# Patient Record
Sex: Male | Born: 1965 | Race: White | Hispanic: No | Marital: Married | Smoking: Never smoker
Health system: Southern US, Community
[De-identification: ages and names within clinical notes are randomized; demographics above are authoritative.]

## PROBLEM LIST (undated history)

## (undated) DIAGNOSIS — K219 Gastro-esophageal reflux disease without esophagitis: Secondary | ICD-10-CM

## (undated) DIAGNOSIS — F419 Anxiety disorder, unspecified: Secondary | ICD-10-CM

## (undated) DIAGNOSIS — R7989 Other specified abnormal findings of blood chemistry: Secondary | ICD-10-CM

## (undated) DIAGNOSIS — E785 Hyperlipidemia, unspecified: Secondary | ICD-10-CM

## (undated) DIAGNOSIS — Z87442 Personal history of urinary calculi: Secondary | ICD-10-CM

## (undated) DIAGNOSIS — E039 Hypothyroidism, unspecified: Secondary | ICD-10-CM

## (undated) HISTORY — DX: Hyperlipidemia, unspecified: E78.5

## (undated) HISTORY — DX: Anxiety disorder, unspecified: F41.9

## (undated) HISTORY — DX: Personal history of urinary calculi: Z87.442

## (undated) HISTORY — DX: Hypothyroidism, unspecified: E03.9

## (undated) HISTORY — DX: Gastro-esophageal reflux disease without esophagitis: K21.9

## (undated) HISTORY — DX: Other specified abnormal findings of blood chemistry: R79.89

---

## 2006-03-10 ENCOUNTER — Emergency Department: Payer: Self-pay | Admitting: Emergency Medicine

## 2011-09-30 ENCOUNTER — Ambulatory Visit: Payer: Self-pay

## 2011-10-16 ENCOUNTER — Ambulatory Visit: Payer: Self-pay

## 2012-11-14 ENCOUNTER — Ambulatory Visit: Payer: Self-pay | Admitting: Emergency Medicine

## 2015-04-16 ENCOUNTER — Telehealth: Payer: Self-pay | Admitting: Family Medicine

## 2015-04-18 ENCOUNTER — Telehealth: Payer: Self-pay

## 2015-04-18 NOTE — Telephone Encounter (Signed)
Patient called to advise that he had not received a returned phone call regarding his refill request.  Explained that there was an error with routing his original message and that his refill request would be submitted for approval.  Please call patient to advise when/if prescription is available for pick up.

## 2015-04-20 MED ORDER — LORAZEPAM 1 MG PO TABS: 1.0000 mg | ORAL_TABLET | Freq: Every day | ORAL | Status: DC | PRN

## 2015-04-20 NOTE — Telephone Encounter (Signed)
Refill lorezapam To call in

## 2015-04-21 NOTE — Telephone Encounter (Signed)
Patient called to check the status of his refill request for Lorazepam . No pharmacy noted as patient will have to pick up prescription.

## 2015-04-21 NOTE — Telephone Encounter (Signed)
rx was [facility] to [facility] earlier today, per another msg

## 2016-01-23 ENCOUNTER — Other Ambulatory Visit: Payer: Self-pay | Admitting: Family Medicine

## 2016-01-26 ENCOUNTER — Other Ambulatory Visit: Payer: Self-pay | Admitting: Family Medicine

## 2016-01-26 ENCOUNTER — Encounter: Payer: Self-pay | Admitting: Family Medicine

## 2016-01-26 NOTE — Telephone Encounter (Signed)
Letter sent.

## 2016-01-26 NOTE — Telephone Encounter (Signed)
apt 

## 2016-01-27 ENCOUNTER — Other Ambulatory Visit: Payer: Self-pay | Admitting: Unknown Physician Specialty

## 2016-01-28 NOTE — Telephone Encounter (Signed)
Letter was sent to patient by Gina on 4/17[name]/17 for Dr. Dossie Arbourrissman.

## 2016-01-28 NOTE — Telephone Encounter (Signed)
Needs appt further refills 

## 2016-01-30 DIAGNOSIS — K219 Gastro-esophageal reflux disease without esophagitis: Secondary | ICD-10-CM | POA: Insufficient documentation

## 2016-01-30 DIAGNOSIS — E039 Hypothyroidism, unspecified: Secondary | ICD-10-CM | POA: Insufficient documentation

## 2016-01-30 DIAGNOSIS — E785 Hyperlipidemia, unspecified: Secondary | ICD-10-CM | POA: Insufficient documentation

## 2016-01-30 DIAGNOSIS — Z87442 Personal history of urinary calculi: Secondary | ICD-10-CM | POA: Insufficient documentation

## 2016-01-30 DIAGNOSIS — F419 Anxiety disorder, unspecified: Secondary | ICD-10-CM | POA: Insufficient documentation

## 2016-02-02 ENCOUNTER — Telehealth: Payer: Self-pay | Admitting: Family Medicine

## 2016-02-02 MED ORDER — PRAVASTATIN SODIUM 20 MG PO TABS: 20.0000 mg | ORAL_TABLET | Freq: Every day | ORAL | Status: DC

## 2016-02-02 NOTE — Telephone Encounter (Signed)
Pt also needs Pravastatin sent to pharmacy.  Per pt they were supposed to request this with the levothyroxine.  He has made an appt for May 17 for med fu.

## 2016-02-25 ENCOUNTER — Encounter: Payer: Self-pay | Admitting: Family Medicine

## 2016-02-25 ENCOUNTER — Ambulatory Visit (INDEPENDENT_AMBULATORY_CARE_PROVIDER_SITE_OTHER): Payer: 59 | Admitting: Family Medicine

## 2016-02-25 VITALS — BP 111/80 | HR 92 | Temp 98.0°F | Ht 65.1 in | Wt 205.0 lb

## 2016-02-25 DIAGNOSIS — F329 Major depressive disorder, single episode, unspecified: Secondary | ICD-10-CM

## 2016-02-25 DIAGNOSIS — F32A Depression, unspecified: Secondary | ICD-10-CM

## 2016-02-25 DIAGNOSIS — E785 Hyperlipidemia, unspecified: Secondary | ICD-10-CM | POA: Diagnosis not present

## 2016-02-25 DIAGNOSIS — E039 Hypothyroidism, unspecified: Secondary | ICD-10-CM | POA: Diagnosis not present

## 2016-02-25 DIAGNOSIS — R5383 Other fatigue: Secondary | ICD-10-CM | POA: Diagnosis not present

## 2016-02-25 DIAGNOSIS — F339 Major depressive disorder, recurrent, unspecified: Secondary | ICD-10-CM | POA: Insufficient documentation

## 2016-02-25 MED ORDER — FLUOXETINE HCL 20 MG PO TABS: 20.0000 mg | ORAL_TABLET | Freq: Every day | ORAL | Status: DC

## 2016-02-25 NOTE — Progress Notes (Signed)
BP 111/80 mmHg  Pulse 92  Temp(Src) 98 F (36.7 C)  Ht 5' 5.1" (1.654 m)  Wt 205 lb (92.987 kg)  BMI 33.99 kg/m2  SpO2 98%   Subjective:    Patient ID: Michael Gillespie, male    DOB: [birth year], 50 y.o.   MRN: [id number]  HPI: Michael Gillespie is a 50 y.o. male  Chief Complaint  Patient presents with  . Hyperlipidemia  . Hypothyroidism  . Anxiety  Patient with multiple concerns. Primarily extreme stress at home with pregnant daughter and breakup with her husband and living with him, mother slowly dying with dementia, 73 year old son with ADHD area patient finds work a release. Having great deal of problems with sleep and energy loss of interest in usual things being depressed and sad and blue. Patient concerned may have low testosterone causing these symptoms has is read on the Internet. Taking lorazepam rarely. Taking cholesterol medicine without problems or issues.  Relevant past medical, surgical, family and social history reviewed and updated as indicated. Interim medical history since our last visit reviewed. Allergies and medications reviewed and updated.  Other than noted above Review of Systems  Constitutional: Positive for fatigue.  Respiratory: Negative.   Cardiovascular: Negative.     Per HPI unless specifically indicated above     Objective:    BP 111/80 mmHg  Pulse 92  Temp(Src) 98 F (36.7 C)  Ht 5' 5.1" (1.654 m)  Wt 205 lb (92.987 kg)  BMI 33.99 kg/m2  SpO2 98%  Wt Readings from Last 3 Encounters:  02/25/16 205 lb (92.987 kg)  12/31/14 183 lb (83.008 kg)    Physical Exam  Constitutional: He is oriented to person, place, and time. He appears well-developed and well-nourished. No distress.  HENT:  Head: Normocephalic and atraumatic.  Right Ear: Hearing normal.  Left Ear: Hearing normal.  Nose: Nose normal.  Eyes: Conjunctivae and lids are normal. Right eye exhibits no discharge. Left eye exhibits no discharge. No scleral icterus.  Neck: No  thyromegaly present.  Cardiovascular: Normal rate, regular rhythm and normal heart sounds.   Pulmonary/Chest: Effort normal and breath sounds normal. No respiratory distress.  Musculoskeletal: Normal range of motion.  Neurological: He is alert and oriented to person, place, and time.  Skin: Skin is intact. No rash noted.  Psychiatric: He has a normal mood and affect. His speech is normal and behavior is normal. Judgment and thought content normal. Cognition and memory are normal.    No results found for this or any previous visit.    Assessment & Plan:   Problem List Items Addressed This Visit      Endocrine   Hypothyroidism    The current medical regimen is effective;  continue present plan and medications.       Relevant Orders   TSH   Testosterone     Other   Hyperlipidemia    The current medical regimen is effective;  continue present plan and medications.       Depression - Primary    Discussed depression care and treatment will start fluoxetine patient education given cautions about activation.      Relevant Medications   FLUoxetine (PROZAC) 20 MG tablet   Other Relevant Orders   Comprehensive metabolic panel   CBC with Differential/Platelet   Testosterone    Other Visit Diagnoses    Other fatigue        Most likely secondary to depression but will check testosterone to request  Relevant Orders    Comprehensive metabolic panel    CBC with Differential/Platelet    Testosterone        Follow up plan: Return for Follow-up 2-4 weeks depression check also schedule physical later this summer..Marland Kitchen

## 2016-02-25 NOTE — Assessment & Plan Note (Signed)
The current medical regimen is effective;  continue present plan and medications.  

## 2016-02-25 NOTE — Assessment & Plan Note (Signed)
Discussed depression care and treatment will start fluoxetine patient education given cautions about activation.

## 2016-02-26 ENCOUNTER — Telehealth: Payer: Self-pay | Admitting: Family Medicine

## 2016-02-26 DIAGNOSIS — E039 Hypothyroidism, unspecified: Secondary | ICD-10-CM

## 2016-02-26 DIAGNOSIS — R748 Abnormal levels of other serum enzymes: Secondary | ICD-10-CM

## 2016-02-26 DIAGNOSIS — R7989 Other specified abnormal findings of blood chemistry: Secondary | ICD-10-CM

## 2016-02-26 LAB — COMPREHENSIVE METABOLIC PANEL
ALBUMIN: 4.4 g/dL (ref 3.5–5.5)
ALT: 94 IU/L — ABNORMAL HIGH (ref 0–44)
AST: 54 IU/L — ABNORMAL HIGH (ref 0–40)
Albumin/Globulin Ratio: 1.8 (ref 1.2–2.2)
Alkaline Phosphatase: 63 IU/L (ref 39–117)
BUN / CREAT RATIO: 14 (ref 9–20)
BUN: 16 mg/dL (ref 6–24)
Bilirubin Total: 0.3 mg/dL (ref 0.0–1.2)
CALCIUM: 9.3 mg/dL (ref 8.7–10.2)
CHLORIDE: 99 mmol/L (ref 96–106)
CO2: 25 mmol/L (ref 18–29)
Creatinine, Ser: 1.17 mg/dL (ref 0.76–1.27)
GFR calc non Af Amer: 73 mL/min/{1.73_m2} (ref >59)
GFR, EST AFRICAN AMERICAN: 85 mL/min/{1.73_m2} (ref >59)
GLOBULIN, TOTAL: 2.4 g/dL (ref 1.5–4.5)
Glucose: 101 mg/dL — ABNORMAL HIGH (ref 65–99)
Potassium: 4.7 mmol/L (ref 3.5–5.2)
Sodium: 139 mmol/L (ref 134–144)
TOTAL PROTEIN: 6.8 g/dL (ref 6.0–8.5)

## 2016-02-26 LAB — CBC WITH DIFFERENTIAL/PLATELET
Basophils Absolute: 0 10*3/uL (ref 0.0–0.2)
Basos: 1 %
EOS (ABSOLUTE): 0.2 10*3/uL (ref 0.0–0.4)
Eos: 3 %
HEMOGLOBIN: 14.7 g/dL (ref 12.6–17.7)
Hematocrit: 42.5 % (ref 37.5–51.0)
IMMATURE GRANS (ABS): 0.1 10*3/uL (ref 0.0–0.1)
Immature Granulocytes: 1 %
Lymphocytes Absolute: 1.9 10*3/uL (ref 0.7–3.1)
Lymphs: 29 %
MCH: 30.6 pg (ref 26.6–33.0)
MCHC: 34.6 g/dL (ref 31.5–35.7)
MCV: 89 fL (ref 79–97)
MONOCYTES: 10 %
Monocytes Absolute: 0.6 10*3/uL (ref 0.1–0.9)
NEUTROS ABS: 3.6 10*3/uL (ref 1.4–7.0)
NEUTROS PCT: 56 %
Platelets: 198 10*3/uL (ref 150–379)
RBC: 4.8 x10E6/uL (ref 4.14–5.80)
RDW: 14.1 % (ref 12.3–15.4)
WBC: 6.4 10*3/uL (ref 3.4–10.8)

## 2016-02-26 LAB — TESTOSTERONE: TESTOSTERONE: 202 ng/dL — AB (ref 348–1197)

## 2016-02-26 LAB — TSH: TSH: 13.84 u[IU]/mL — ABNORMAL HIGH (ref 0.450–4.500)

## 2016-02-26 NOTE — Telephone Encounter (Signed)
Phone call Discussed with patient elevated TSH patient relates taking his thyroid with his cholesterol medicine in the morning and eating. Discussed proper way to take thyroid medication which patient will start doing area and Recheck TSH next office visit. Patient's liver enzymes elevated possibly related to pravastatin Will recheck lipids and liver next office visit. Patient's testosterone also low will check testosterone free testosterone prolactin FSH and LH. These tests need to be before 10 AM

## 2016-03-15 ENCOUNTER — Other Ambulatory Visit: Payer: Self-pay | Admitting: Family Medicine

## 2016-03-23 ENCOUNTER — Ambulatory Visit (INDEPENDENT_AMBULATORY_CARE_PROVIDER_SITE_OTHER): Payer: 59 | Admitting: Family Medicine

## 2016-03-23 ENCOUNTER — Encounter: Payer: Self-pay | Admitting: Family Medicine

## 2016-03-23 VITALS — BP 122/86 | HR 89 | Temp 99.0°F | Ht 65.1 in | Wt 204.0 lb

## 2016-03-23 DIAGNOSIS — E785 Hyperlipidemia, unspecified: Secondary | ICD-10-CM | POA: Diagnosis not present

## 2016-03-23 DIAGNOSIS — R748 Abnormal levels of other serum enzymes: Secondary | ICD-10-CM | POA: Diagnosis not present

## 2016-03-23 DIAGNOSIS — E039 Hypothyroidism, unspecified: Secondary | ICD-10-CM

## 2016-03-23 DIAGNOSIS — F329 Major depressive disorder, single episode, unspecified: Secondary | ICD-10-CM | POA: Diagnosis not present

## 2016-03-23 DIAGNOSIS — F32A Depression, unspecified: Secondary | ICD-10-CM

## 2016-03-23 DIAGNOSIS — E291 Testicular hypofunction: Secondary | ICD-10-CM

## 2016-03-23 DIAGNOSIS — R7989 Other specified abnormal findings of blood chemistry: Secondary | ICD-10-CM

## 2016-03-23 LAB — LP+ALT+AST PICCOLO, WAIVED
ALT (SGPT) PICCOLO, WAIVED: 98 U/L — AB (ref 10–47)
AST (SGOT) PICCOLO, WAIVED: 68 U/L — AB (ref 11–38)
CHOLESTEROL PICCOLO, WAIVED: 194 mg/dL (ref <200)
Chol/HDL Ratio Piccolo,Waive: 5.4 mg/dL — ABNORMAL HIGH
HDL CHOL PICCOLO, WAIVED: 36 mg/dL — AB (ref >59)
LDL CHOL CALC PICCOLO WAIVED: 91 mg/dL (ref <100)
Triglycerides Piccolo,Waived: 337 mg/dL — ABNORMAL HIGH (ref <150)
VLDL Chol Calc Piccolo,Waive: 67 mg/dL — ABNORMAL HIGH (ref <30)

## 2016-03-23 MED ORDER — BUPROPION HCL ER (SR) 150 MG PO TB12: 150.0000 mg | ORAL_TABLET | Freq: Two times a day (BID) | ORAL | Status: DC

## 2016-03-23 NOTE — Assessment & Plan Note (Signed)
Rechecking levels today. Will adjust meds as needed. Continue to monitor.

## 2016-03-23 NOTE — Assessment & Plan Note (Signed)
Could not tolerate prozac. Would like something that may make him lose weight. Discussed Wellbutrin and he would like to try it. Rx given today. Follow up in 1 month to see how he's doing.

## 2016-03-23 NOTE — Patient Instructions (Signed)
Bupropion tablets (Depression/Mood Disorders) What is this medicine? BUPROPION (byoo PROE pee on) is used to treat depression. This medicine may be used for other purposes; ask your health care provider or pharmacist if you have questions. What should I tell my health care provider before I take this medicine? They need to know if you have any of these conditions: -an eating disorder, such as anorexia or bulimia -bipolar disorder or psychosis -diabetes or high blood sugar, treated with medication -glaucoma -heart disease, previous heart attack, or irregular heart beat -head injury or brain tumor -high blood pressure -kidney or liver disease -seizures -suicidal thoughts or a previous suicide attempt -Tourette's syndrome -weight loss -an unusual or allergic reaction to bupropion, other medicines, foods, dyes, or preservatives -breast-feeding -pregnant or trying to become pregnant How should I use this medicine? Take this medicine by mouth with a glass of water. Follow the directions on the prescription label. You can take it with or without food. If it upsets your stomach, take it with food. Take your medicine at regular intervals. Do not take your medicine more often than directed. Do not stop taking this medicine suddenly except upon the advice of your doctor. Stopping this medicine too quickly may cause serious side effects or your condition may worsen. A special MedGuide will be given to you by the pharmacist with each prescription and refill. Be sure to read this information carefully each time. Talk to your pediatrician regarding the use of this medicine in children. Special care may be needed. Overdosage: If you think you have taken too much of this medicine contact a poison control center or emergency room at once. NOTE: This medicine is only for you. Do not share this medicine with others. What if I miss a dose? If you miss a dose, take it as soon as you can. If it is less than  four hours to your next dose, take only that dose and skip the missed dose. Do not take double or extra doses. What may interact with this medicine? Do not take this medicine with any of the following medications: -linezolid -MAOIs like Azilect, Carbex, Eldepryl, Marplan, Nardil, and Parnate -methylene blue (injected into a vein) -other medicines that contain bupropion like Zyban This medicine may also interact with the following medications: -alcohol -certain medicines for anxiety or sleep -certain medicines for blood pressure like metoprolol, propranolol -certain medicines for depression or psychotic disturbances -certain medicines for HIV or AIDS like efavirenz, lopinavir, nelfinavir, ritonavir -certain medicines for irregular heart beat like propafenone, flecainide -certain medicines for Parkinson's disease like amantadine, levodopa -certain medicines for seizures like carbamazepine, phenytoin, phenobarbital -cimetidine -clopidogrel -cyclophosphamide -furazolidone -isoniazid -nicotine -orphenadrine -procarbazine -steroid medicines like prednisone or cortisone -stimulant medicines for attention disorders, weight loss, or to stay awake -tamoxifen -theophylline -thiotepa -ticlopidine -tramadol -warfarin This list may not describe all possible interactions. Give your health care provider a list of all the medicines, herbs, non-prescription drugs, or dietary supplements you use. Also tell them if you smoke, drink alcohol, or use illegal drugs. Some items may interact with your medicine. What should I watch for while using this medicine? Tell your doctor if your symptoms do not get better or if they get worse. Visit your doctor or health care professional for regular checks on your progress. Because it may take several weeks to see the full effects of this medicine, it is important to continue your treatment as prescribed by your doctor. Patients and their families should watch out  for new   or worsening thoughts of suicide or depression. Also watch out for sudden changes in feelings such as feeling anxious, agitated, panicky, irritable, hostile, aggressive, impulsive, severely restless, overly excited and hyperactive, or not being able to sleep. If this happens, especially at the beginning of treatment or after a change in dose, call your health care professional. Avoid alcoholic drinks while taking this medicine. Drinking excessive alcoholic beverages, using sleeping or anxiety medicines, or quickly stopping the use of these agents while taking this medicine may increase your risk for a seizure. Do not drive or use heavy machinery until you know how this medicine affects you. This medicine can impair your ability to perform these tasks. Do not take this medicine close to bedtime. It may prevent you from sleeping. Your mouth may get dry. Chewing sugarless gum or sucking hard candy, and drinking plenty of water may help. Contact your doctor if the problem does not go away or is severe. What side effects may I notice from receiving this medicine? Side effects that you should report to your doctor or health care professional as soon as possible: -allergic reactions like skin rash, itching or hives, swelling of the face, lips, or tongue -breathing problems -changes in vision -confusion -fast or irregular heartbeat -hallucinations -increased blood pressure -redness, blistering, peeling or loosening of the skin, including inside the mouth -seizures -suicidal thoughts or other mood changes -unusually weak or tired -vomiting Side effects that usually do not require medical attention (report to your doctor or health care professional if they continue or are bothersome): -change in sex drive or performance -constipation -headache -loss of appetite -nausea -tremors -weight loss This list may not describe all possible side effects. Call your doctor for medical advice about side  effects. You may report side effects to FDA at 1-800-FDA-1088. Where should I keep my medicine? Keep out of the reach of children. Store at room temperature between 15 and 25 degrees C (59 and 77 degrees F), away from direct sunlight and moisture. Keep tightly closed. Throw away any unused medicine after the expiration date. NOTE: This sheet is a summary. It may not cover all possible information. If you have questions about this medicine, talk to your doctor, pharmacist, or health care provider.    2016, Elsevier/Gold Standard. (2013-04-20 12:42:42)  

## 2016-03-23 NOTE — Progress Notes (Signed)
BP 122/86 mmHg  Pulse 89  Temp(Src) 99 F (37.2 C)  Ht 5' 5.1" (1.654 m)  Wt 204 lb (92.534 kg)  BMI 33.82 kg/m2  SpO2 99%   Subjective:    Patient ID: Michael Gillespie, male    DOB: 1966/05/08, 50 y.o.   MRN: [id number]  HPI: Michael Gillespie is a 50 y.o. male  Chief Complaint  Patient presents with  . abnormal labs    Patient is here to have his labs rechecked, low testosterone, elevated liver enzymes and abnormal tsh   HYPOTHYROIDISM Thyroid control status:stable- not feeling any better.  Satisfied with current treatment? no Medication side effects: no Medication compliance: excellent compliance Recent dose adjustment:no Fatigue: yes Cold intolerance: no Heat intolerance: no Weight gain: yes Weight loss: no Constipation: no Diarrhea/loose stools: no Palpitations: no Lower extremity edema: no Anxiety/depressed mood: yes  Could not tolerate prozac. Made him very dizzy. Stopped it after 6 days.  Relevant past medical, surgical, family and social history reviewed and updated as indicated. Interim medical history since our last visit reviewed. Allergies and medications reviewed and updated.  Review of Systems  Constitutional: Positive for fatigue. Negative for fever, chills, diaphoresis, activity change, appetite change and unexpected weight change.  Respiratory: Positive for shortness of breath. Negative for apnea, cough, choking, chest tightness, wheezing and stridor.   Cardiovascular: Negative.   Psychiatric/Behavioral: Positive for dysphoric mood. Negative for suicidal ideas, hallucinations, behavioral problems, confusion, sleep disturbance, self-injury, decreased concentration and agitation. The patient is nervous/anxious. The patient is not hyperactive.     Per HPI unless specifically indicated above     Objective:    BP 122/86 mmHg  Pulse 89  Temp(Src) 99 F (37.2 C)  Ht 5' 5.1" (1.654 m)  Wt 204 lb (92.534 kg)  BMI 33.82 kg/m2  SpO2 99%  Wt Readings  from Last 3 Encounters:  03/23/16 204 lb (92.534 kg)  02/25/16 205 lb (92.987 kg)  12/31/14 183 lb (83.008 kg)    Physical Exam  Constitutional: He is oriented to person, place, and time. He appears well-developed and well-nourished. No distress.  HENT:  Head: Normocephalic and atraumatic.  Right Ear: Hearing normal.  Left Ear: Hearing normal.  Nose: Nose normal.  Eyes: Conjunctivae and lids are normal. Right eye exhibits no discharge. Left eye exhibits no discharge. No scleral icterus.  Pulmonary/Chest: Effort normal. No respiratory distress.  Musculoskeletal: Normal range of motion.  Neurological: He is alert and oriented to person, place, and time.  Skin: Skin is warm, dry and intact. No rash noted. No erythema. No pallor.  Psychiatric: He has a normal mood and affect. His speech is normal and behavior is normal. Judgment and thought content normal. Cognition and memory are normal.  Nursing note and vitals reviewed.   Results for orders placed or performed in visit on 02/25/16  Comprehensive metabolic panel  Result Value Ref Range   Glucose 101 (H) 65 - 99 mg/dL   BUN 16 6 - 24 mg/dL   Creatinine, Ser 4.09 0.76 - 1.27 mg/dL   GFR calc non Af Amer 73 >59 mL/min/1.73   GFR calc Af Amer 85 >59 mL/min/1.73   BUN/Creatinine Ratio 14 9 - 20   Sodium 139 134 - 144 mmol/L   Potassium 4.7 3.5 - 5.2 mmol/L   Chloride 99 96 - 106 mmol/L   CO2 25 18 - 29 mmol/L   Calcium 9.3 8.7 - 10.2 mg/dL   Total Protein 6.8 6.0 - 8.5 g/dL  Albumin 4.4 3.5 - 5.5 g/dL   Globulin, Total 2.4 1.5 - 4.5 g/dL   Albumin/Globulin Ratio 1.8 1.2 - 2.2   Bilirubin Total 0.3 0.0 - 1.2 mg/dL   Alkaline Phosphatase 63 39 - 117 IU/L   AST 54 (H) 0 - 40 IU/L   ALT 94 (H) 0 - 44 IU/L  CBC with Differential/Platelet  Result Value Ref Range   WBC 6.4 3.4 - 10.8 x10E3/uL   RBC 4.80 4.14 - 5.80 x10E6/uL   Hemoglobin 14.7 12.6 - 17.7 g/dL   Hematocrit 95.242.5 84.137.5 - 51.0 %   MCV 89 79 - 97 fL   MCH 30.6 26.6 -  33.0 pg   MCHC 34.6 31.5 - 35.7 g/dL   RDW 32.414.1 40.112.3 - 02.715.4 %   Platelets 198 150 - 379 x10E3/uL   Neutrophils 56 %   Lymphs 29 %   Monocytes 10 %   Eos 3 %   Basos 1 %   Neutrophils Absolute 3.6 1.4 - 7.0 x10E3/uL   Lymphocytes Absolute 1.9 0.7 - 3.1 x10E3/uL   Monocytes Absolute 0.6 0.1 - 0.9 x10E3/uL   EOS (ABSOLUTE) 0.2 0.0 - 0.4 x10E3/uL   Basophils Absolute 0.0 0.0 - 0.2 x10E3/uL   Immature Granulocytes 1 %   Immature Grans (Abs) 0.1 0.0 - 0.1 x10E3/uL  TSH  Result Value Ref Range   TSH 13.840 (H) 0.450 - 4.500 uIU/mL  Testosterone  Result Value Ref Range   Testosterone 202 (L) 348 - 1197 ng/dL   Comment, Testosterone Comment       Assessment & Plan:   Problem List Items Addressed This Visit      Endocrine   Hypothyroidism - Primary    Rechecking levels today. Will adjust meds as needed. Continue to monitor.        Other   Hyperlipidemia    Under good control, but liver functions going up. Will stop pravastatin for 1 month and recheck at that time.       Depression    Could not tolerate prozac. Would like something that may make him lose weight. Discussed Wellbutrin and he would like to try it. Rx given today. Follow up in 1 month to see how he's doing.       Relevant Medications   buPROPion (WELLBUTRIN SR) 150 MG 12 hr tablet    Other Visit Diagnoses    Elevated liver enzymes        Likely due to statin. Rechecking today. Increased again. Will stop pravastatin and recheck in 1 month.     Low testosterone        Confirmatory tests drawn today. Await results        Follow up plan: Return in about 4 weeks (around 04/20/2016) for Follow up mood, cholesterol, liver functions.

## 2016-03-23 NOTE — Assessment & Plan Note (Signed)
Under good control, but liver functions going up. Will stop pravastatin for 1 month and recheck at that time.

## 2016-03-24 LAB — SPECIMEN STATUS

## 2016-03-25 ENCOUNTER — Telehealth: Payer: Self-pay | Admitting: Family Medicine

## 2016-03-25 LAB — TESTOSTERONE, FREE, TOTAL, SHBG
SEX HORMONE BINDING: 14.8 nmol/L — AB (ref 16.5–55.9)
TESTOSTERONE FREE: 6.7 pg/mL — AB (ref 6.8–21.5)
TESTOSTERONE: 202 ng/dL — AB (ref 348–1197)

## 2016-03-25 LAB — LUTEINIZING HORMONE: LH: 2.8 m[IU]/mL (ref 1.7–8.6)

## 2016-03-25 LAB — PROLACTIN: Prolactin: 10.2 ng/mL (ref 4.0–15.2)

## 2016-03-25 LAB — FOLLICLE STIMULATING HORMONE: FSH: 4.8 m[IU]/mL (ref 1.5–12.4)

## 2016-03-25 LAB — TSH: TSH: 1.73 u[IU]/mL (ref 0.450–4.500)

## 2016-03-25 NOTE — Telephone Encounter (Signed)
Called and discussed results with Homero FellersFrank. Labs normal, slightly low testosterone. Likely due to increased weight. Will work on exercise and weight loss and recheck next month at follow up.

## 2016-04-11 ENCOUNTER — Other Ambulatory Visit: Payer: Self-pay | Admitting: Family Medicine

## 2016-04-23 ENCOUNTER — Ambulatory Visit (INDEPENDENT_AMBULATORY_CARE_PROVIDER_SITE_OTHER): Payer: 59 | Admitting: Family Medicine

## 2016-04-23 ENCOUNTER — Encounter: Payer: Self-pay | Admitting: Family Medicine

## 2016-04-23 ENCOUNTER — Other Ambulatory Visit: Payer: Self-pay | Admitting: Family Medicine

## 2016-04-23 VITALS — BP 117/74 | HR 89 | Temp 98.6°F | Ht 65.1 in | Wt 197.0 lb

## 2016-04-23 DIAGNOSIS — E039 Hypothyroidism, unspecified: Secondary | ICD-10-CM | POA: Diagnosis not present

## 2016-04-23 DIAGNOSIS — E785 Hyperlipidemia, unspecified: Secondary | ICD-10-CM

## 2016-04-23 DIAGNOSIS — E291 Testicular hypofunction: Secondary | ICD-10-CM

## 2016-04-23 DIAGNOSIS — R945 Abnormal results of liver function studies: Secondary | ICD-10-CM

## 2016-04-23 DIAGNOSIS — R7989 Other specified abnormal findings of blood chemistry: Secondary | ICD-10-CM

## 2016-04-23 DIAGNOSIS — F419 Anxiety disorder, unspecified: Secondary | ICD-10-CM | POA: Diagnosis not present

## 2016-04-23 LAB — LIPID PANEL PICCOLO, WAIVED
CHOLESTEROL PICCOLO, WAIVED: 186 mg/dL (ref <200)
Chol/HDL Ratio Piccolo,Waive: 4.5 mg/dL
HDL Chol Piccolo, Waived: 41 mg/dL — ABNORMAL LOW (ref >59)
LDL CHOL CALC PICCOLO WAIVED: 94 mg/dL (ref <100)
Triglycerides Piccolo,Waived: 255 mg/dL — ABNORMAL HIGH (ref <150)
VLDL Chol Calc Piccolo,Waive: 51 mg/dL — ABNORMAL HIGH (ref <30)

## 2016-04-23 NOTE — Assessment & Plan Note (Signed)
Stable last check, but still not feeling well- rechecking levels today.

## 2016-04-23 NOTE — Assessment & Plan Note (Signed)
Stable off medicine. Continue diet and exercise. Continue to monitor.

## 2016-04-23 NOTE — Progress Notes (Signed)
BP 117/74 mmHg  Pulse 89  Temp(Src) 98.6 F (37 C)  Ht 5' 5.1" (1.654 m)  Wt 197 lb (89.359 kg)  BMI 32.66 kg/m2  SpO2 98%   Subjective:    Patient ID: Michael Gillespie, male    DOB: [birth year], 50 y.o.   MRN: [id number]  HPI: Michael Gillespie is a 50 y.o. male  Chief Complaint  Patient presents with  . Hypothyroidism  . Depression  . Liver function   HYPERLIPIDEMIA- off medicine due to elevated LFTs. Feeling well with no concerns.   HYPOTHYROIDISM Thyroid control status:controlled Satisfied with current treatment? yes Medication side effects: no Medication compliance: excellent compliance Recent dose adjustment:no Fatigue: yes Cold intolerance: no Heat intolerance: no Weight gain: no Weight loss: yes Constipation: no Diarrhea/loose stools: no Palpitations: no Lower extremity edema: no Anxiety/depressed mood: yes  Concerned about his testosterone. Still feeling tired. Rechecking today.  Relevant past medical, surgical, family and social history reviewed and updated as indicated. Interim medical history since our last visit reviewed. Allergies and medications reviewed and updated.  Review of Systems  Constitutional: Positive for fatigue. Negative for fever, chills, diaphoresis, activity change, appetite change and unexpected weight change.  Respiratory: Negative.   Cardiovascular: Negative.   Psychiatric/Behavioral: Negative.     Per HPI unless specifically indicated above     Objective:    BP 117/74 mmHg  Pulse 89  Temp(Src) 98.6 F (37 C)  Ht 5' 5.1" (1.654 m)  Wt 197 lb (89.359 kg)  BMI 32.66 kg/m2  SpO2 98%  Wt Readings from Last 3 Encounters:  04/23/16 197 lb (89.359 kg)  03/23/16 204 lb (92.534 kg)  02/25/16 205 lb (92.987 kg)    Physical Exam  Constitutional: He is oriented to person, place, and time. He appears well-developed and well-nourished. No distress.  HENT:  Head: Normocephalic and atraumatic.  Right Ear: Hearing normal.    Left Ear: Hearing normal.  Nose: Nose normal.  Eyes: Conjunctivae and lids are normal. Right eye exhibits no discharge. Left eye exhibits no discharge. No scleral icterus.  Cardiovascular: Normal rate, regular rhythm, normal heart sounds and intact distal pulses.  Exam reveals no gallop and no friction rub.   No murmur heard. Pulmonary/Chest: Effort normal and breath sounds normal. No respiratory distress. He has no wheezes. He has no rales. He exhibits no tenderness.  Musculoskeletal: Normal range of motion.  Neurological: He is alert and oriented to person, place, and time.  Skin: Skin is warm, dry and intact. No rash noted. No erythema. No pallor.  Psychiatric: He has a normal mood and affect. His speech is normal and behavior is normal. Judgment and thought content normal. Cognition and memory are normal.  Nursing note and vitals reviewed.   Results for orders placed or performed in visit on 03/23/16  TSH  Result Value Ref Range   TSH 1.730 0.450 - 4.500 uIU/mL  LP+ALT+AST Piccolo, Waived  Result Value Ref Range   ALT (SGPT) Piccolo, Waived 98 (H) 10 - 47 U/L   AST (SGOT) Piccolo, Waived 68 (H) 11 - 38 U/L   Cholesterol Piccolo, Waived 194 <200 mg/dL   HDL Chol Piccolo, Waived 36 (L) >59 mg/dL   Triglycerides Piccolo,Waived 337 (H) <150 mg/dL   Chol/HDL Ratio Piccolo,Waive 5.4 (H) mg/dL   LDL Chol Calc Piccolo Waived 91 <100 mg/dL   VLDL Chol Calc Piccolo,Waive 67 (H) <30 mg/dL  Prolactin  Result Value Ref Range   Prolactin 10.2 4.0 - 15.2 ng/mL  Luteinizing hormone  Result Value Ref Range   LH 2.8 1.7 - 8.6 mIU/mL  Follicle stimulating hormone  Result Value Ref Range   FSH 4.8 1.5 - 12.4 mIU/mL  Testosterone, Free, Total, SHBG  Result Value Ref Range   Testosterone 202 (L) 348 - 1197 ng/dL   Comment, Testosterone Comment    Testosterone, Free 6.7 (L) 6.8 - 21.5 pg/mL   Sex Hormone Binding 14.8 (L) 16.5 - 55.9 nmol/L  Specimen Status  Result Value Ref Range   WBC  WILL FOLLOW    RBC WILL FOLLOW    Hemoglobin WILL FOLLOW    Hematocrit WILL FOLLOW    MCV WILL FOLLOW    MCH WILL FOLLOW    MCHC WILL FOLLOW    RDW WILL FOLLOW    Platelets WILL FOLLOW    Neutrophils WILL FOLLOW    Lymphs WILL FOLLOW    Monocytes WILL FOLLOW    Eos WILL FOLLOW    Basos WILL FOLLOW    Neutrophils Absolute WILL FOLLOW    Lymphocytes Absolute WILL FOLLOW    Monocytes Absolute WILL FOLLOW    EOS (ABSOLUTE) WILL FOLLOW    Basophils Absolute WILL FOLLOW    Immature Granulocytes WILL FOLLOW    Immature Grans (Abs) WILL FOLLOW       Assessment & Plan:   Problem List Items Addressed This Visit      Endocrine   Hypothyroidism    Stable last check, but still not feeling well- rechecking levels today.        Other   Hyperlipidemia - Primary    Stable off medicine. Continue diet and exercise. Continue to monitor.       Anxiety    Interested in medication. Has failed several. Information given to him about how anxiety is treated. Will follow up with PCP next visit.        Other Visit Diagnoses    Elevated LFTs        Rechecking levels today off pravastatin. If still elevated check for hepatitis and [place] to look for fatty liver. Await results.    Low testosterone        Rechecking levels today, if not better or worse, refer to urology    Relevant Orders    Testosterone, free, total        Follow up plan: Return As scheduled with MAC.

## 2016-04-23 NOTE — Assessment & Plan Note (Signed)
Interested in medication. Has failed several. Information given to him about how anxiety is treated. Will follow up with PCP next visit.

## 2016-04-24 LAB — COMPREHENSIVE METABOLIC PANEL
ALBUMIN: 4.4 g/dL (ref 3.5–5.5)
ALK PHOS: 63 IU/L (ref 39–117)
ALT: 97 IU/L — AB (ref 0–44)
AST: 54 IU/L — ABNORMAL HIGH (ref 0–40)
Albumin/Globulin Ratio: 1.9 (ref 1.2–2.2)
BILIRUBIN TOTAL: 0.3 mg/dL (ref 0.0–1.2)
BUN / CREAT RATIO: 13 (ref 9–20)
BUN: 16 mg/dL (ref 6–24)
CHLORIDE: 101 mmol/L (ref 96–106)
CO2: 23 mmol/L (ref 18–29)
CREATININE: 1.2 mg/dL (ref 0.76–1.27)
Calcium: 9.1 mg/dL (ref 8.7–10.2)
GFR calc non Af Amer: 71 mL/min/{1.73_m2} (ref >59)
GFR, EST AFRICAN AMERICAN: 82 mL/min/{1.73_m2} (ref >59)
GLUCOSE: 94 mg/dL (ref 65–99)
Globulin, Total: 2.3 g/dL (ref 1.5–4.5)
Potassium: 4.4 mmol/L (ref 3.5–5.2)
Sodium: 141 mmol/L (ref 134–144)
TOTAL PROTEIN: 6.7 g/dL (ref 6.0–8.5)

## 2016-04-24 LAB — TSH: TSH: 6.88 u[IU]/mL — AB (ref 0.450–4.500)

## 2016-04-26 ENCOUNTER — Telehealth: Payer: Self-pay | Admitting: Unknown Physician Specialty

## 2016-04-26 DIAGNOSIS — R945 Abnormal results of liver function studies: Principal | ICD-10-CM

## 2016-04-26 DIAGNOSIS — R7989 Other specified abnormal findings of blood chemistry: Secondary | ICD-10-CM | POA: Insufficient documentation

## 2016-04-26 NOTE — Telephone Encounter (Signed)
Discussed labs with pt.  TSH is mildly suppressed but has been on the same dose for a while.  He is drinking no TSH.  Will order an abdominal US for increased LFTs.  Follow Testosterone when it is back as pt feels that is his problem

## 2016-04-27 LAB — TESTOSTERONE, FREE, TOTAL, SHBG
Sex Hormone Binding: 16.8 nmol/L (ref 16.5–55.9)
TESTOSTERONE FREE: 7.1 pg/mL (ref 6.8–21.5)
TESTOSTERONE: 259 ng/dL — AB (ref 264–916)

## 2016-04-27 LAB — SPECIMEN STATUS REPORT

## 2016-04-28 ENCOUNTER — Telehealth: Payer: Self-pay | Admitting: Unknown Physician Specialty

## 2016-04-28 DIAGNOSIS — E291 Testicular hypofunction: Secondary | ICD-10-CM | POA: Insufficient documentation

## 2016-04-28 NOTE — Telephone Encounter (Signed)
Discussed results of Testosterone with patient.  Borderline low.  Pt wishes to consult with Urology for possible treatment

## 2016-04-29 ENCOUNTER — Ambulatory Visit
Admission: RE | Admit: 2016-04-29 | Discharge: 2016-04-29 | Disposition: A | Discharge status: Home / Self Care | Payer: 59 | Source: Ambulatory Visit | Attending: Unknown Physician Specialty | Admitting: Unknown Physician Specialty

## 2016-04-29 DIAGNOSIS — R7989 Other specified abnormal findings of blood chemistry: Secondary | ICD-10-CM | POA: Insufficient documentation

## 2016-04-29 DIAGNOSIS — R945 Abnormal results of liver function studies: Secondary | ICD-10-CM

## 2016-05-03 ENCOUNTER — Other Ambulatory Visit: Payer: Self-pay | Admitting: Family Medicine

## 2016-05-04 NOTE — Telephone Encounter (Signed)
Routing to provider  

## 2016-05-11 ENCOUNTER — Other Ambulatory Visit: Payer: Self-pay | Admitting: Family Medicine

## 2016-05-11 MED ORDER — LORAZEPAM 1 MG PO TABS: 1.0000 mg | ORAL_TABLET | Freq: Every day | ORAL | 0 refills | Status: DC | PRN

## 2016-05-11 NOTE — Telephone Encounter (Signed)
rx

## 2016-05-11 NOTE — Telephone Encounter (Signed)
faxed

## 2016-06-23 ENCOUNTER — Ambulatory Visit: Payer: 59 | Admitting: Urology

## 2016-06-23 ENCOUNTER — Encounter: Payer: Self-pay | Admitting: Urology

## 2016-06-23 ENCOUNTER — Ambulatory Visit (INDEPENDENT_AMBULATORY_CARE_PROVIDER_SITE_OTHER): Payer: 59 | Admitting: Urology

## 2016-06-23 VITALS — BP 113/72 | HR 103 | Ht 65.1 in | Wt 201.7 lb

## 2016-06-23 DIAGNOSIS — E291 Testicular hypofunction: Secondary | ICD-10-CM

## 2016-06-23 DIAGNOSIS — N529 Male erectile dysfunction, unspecified: Secondary | ICD-10-CM

## 2016-06-23 DIAGNOSIS — N528 Other male erectile dysfunction: Secondary | ICD-10-CM

## 2016-06-23 DIAGNOSIS — Z8042 Family history of malignant neoplasm of prostate: Secondary | ICD-10-CM | POA: Diagnosis not present

## 2016-06-23 NOTE — Patient Instructions (Addendum)
Sildenafil tablets (Viagra) What is this medicine? SILDENAFIL (sil DEN a fil) is used to treat erection problems in men. This medicine may be used for other purposes; ask your health care provider or pharmacist if you have questions. What should I tell my health care provider before I take this medicine? They need to know if you have any of these conditions: -bleeding disorders -eye or vision problems, including a rare inherited eye disease called retinitis pigmentosa -anatomical deformation of the penis, Peyronie's disease, or history of priapism (painful and prolonged erection) -heart disease, angina, a history of heart attack, irregular heart beats, or other heart problems -high or low blood pressure -history of blood diseases, like sickle cell anemia or leukemia -history of stomach bleeding -kidney disease -liver disease -stroke -an unusual or allergic reaction to sildenafil, other medicines, foods, dyes, or preservatives -pregnant or trying to get pregnant -breast-feeding How should I use this medicine? Take this medicine by mouth with a glass of water. Follow the directions on the prescription label. The dose is usually taken 1 hour before sexual activity. You should not take the dose more than once per day. Do not take your medicine more often than directed. Talk to your pediatrician regarding the use of this medicine in children. This medicine is not used in children for this condition. Overdosage: If you think you have taken too much of this medicine contact a poison control center or emergency room at once. NOTE: This medicine is only for you. Do not share this medicine with others. What if I miss a dose? This does not apply. Do not take double or extra doses. What may interact with this medicine? Do not take this medicine with any of the following medications: -cisapride -methscopolamine nitrate -nitrates like amyl nitrite, isosorbide dinitrate, isosorbide mononitrate,  nitroglycerin -nitroprusside -other medicines for erectile dysfunction like avanafil, tadalafil, vardenafil -riociguat -other sildenafil products (Revatio) This medicine may also interact with the following medications: -certain drugs for high blood pressure -certain drugs for the treatment of HIV infection or AIDS -certain drugs used for fungal or yeast infections, like fluconazole, itraconazole, ketoconazole, and voriconazole -cimetidine -erythromycin -rifampin This list may not describe all possible interactions. Give your health care provider a list of all the medicines, herbs, non-prescription drugs, or dietary supplements you use. Also tell them if you smoke, drink alcohol, or use illegal drugs. Some items may interact with your medicine. What should I watch for while using this medicine? If you notice any changes in your vision while taking this drug, call your doctor or health care professional as soon as possible. Stop using this medicine and call your health care provider right away if you have a loss of sight in one or both eyes. Contact your doctor or health care professional right away if you have an erection that lasts longer than 4 hours or if it becomes painful. This may be a sign of a serious problem and must be treated right away to prevent permanent damage. If you experience symptoms of nausea, dizziness, chest pain or arm pain upon initiation of sexual activity after taking this medicine, you should refrain from further activity and call your doctor or health care professional as soon as possible. Do not drink alcohol to excess (examples, 5 glasses of wine or 5 shots of whiskey) when taking this medicine. When taken in excess, alcohol can increase your chances of getting a headache or getting dizzy, increasing your heart rate or lowering your blood pressure. Using this medicine   does not protect you or your partner against HIV infection (the virus that causes AIDS) or other  sexually transmitted diseases. What side effects may I notice from receiving this medicine? Side effects that you should report to your doctor or health care professional as soon as possible: -allergic reactions like skin rash, itching or hives, swelling of the face, lips, or tongue -breathing problems -changes in hearing -changes in vision -chest pain -fast, irregular heartbeat -prolonged or painful erection -seizures Side effects that usually do not require medical attention (report to your doctor or health care professional if they continue or are bothersome): -back pain -dizziness -flushing -headache -indigestion -muscle aches -nausea -stuffy or runny nose This list may not describe all possible side effects. Call your doctor for medical advice about side effects. You may report side effects to FDA at 1-800-FDA-1088. Where should I keep my medicine? Keep out of reach of children. Store at room temperature between 15 and 30 degrees C (59 and 86 degrees F). Throw away any unused medicine after the expiration date. NOTE: This sheet is a summary. It may not cover all possible information. If you have questions about this medicine, talk to your doctor, pharmacist, or health care provider.    2016, Elsevier/Gold Standard. (2014-02-15 13:19:04)  

## 2016-06-23 NOTE — Progress Notes (Signed)
06/23/2016 11:45 AM   Michael Gillespie Jan 07, 1966 [id number]  Referring provider: Steele SizerMark A Crissman, MD [address] [place], [place] [id number]  Chief Complaint  Patient presents with  . New Patient (Initial Visit)    low Testosterone    HPI: Patient is a 50 year old Caucasian male who is referred from Dr. Laural BenesJohnson for hypogonadism.      Patient is experiencing a decrease in libido, a lack of energy, a decrease in strength, a loss in height,  a decreased enjoyment in life, sadness and/or grumpiness and erections being less strong.  This is indicated by his responses to the ADAM questionnaire.  He is still having spontaneous erections at night.  He does not have sleep apnea.  His pretreatment morning testosterone levels were 202 ng/dL on 52/84/132406/13/2017 and 401259 ng/dL on 02/72/536607/14/2017.    -FSH  4.8 on 03/23/2016 -Prolactin  10.2 on 03/23/2016 -LH 2.8 on 03/23/2016  He had been on AndroGel in the past, but he found it ineffective.          Androgen Deficiency in the Aging Male    Row Name 06/23/16 1100         Androgen Deficiency in the Aging Male   Do you have a decrease in libido (sex drive) Yes     Do you have lack of energy Yes     Do you have a decrease in strength and/or endurance Yes     Have you lost height Yes     Have you noticed a decreased "enjoyment of life" Yes     Are you sad and/or grumpy Yes     Are your erections less strong Yes     Have you noticed a recent deterioration in your ability to play sports No     Are you falling asleep after dinner No     Has there been a recent deterioration in your work performance No         His IPSS score today is 0, which is no lower urinary tract symptomatology. He is delighted with his quality life due to his urinary symptoms.  He denies any dysuria, hematuria or suprapubic pain.  He also denies any recent fevers, chills, nausea or vomiting.  His father was diagnosed with prostate cancer in his 6650's and underwent  prostatectomy.  He is still alive.  Patient believes he has not had a reoccurrence.        IPSS    Row Name 06/23/16 1100         International Prostate Symptom Score   How often have you had the sensation of not emptying your bladder? Not at All     How often have you had to urinate less than every two hours? Not at All     How often have you found you stopped and started again several times when you urinated? Not at All     How often have you found it difficult to postpone urination? Not at All     How often have you had a weak urinary stream? Not at All     How often have you had to strain to start urination? Not at All     How many times did you typically get up at night to urinate? None     Total IPSS Score 0       Quality of Life due to urinary symptoms   If you were to spend the rest of your  life with your urinary condition just the way it is now how would you feel about that? Delighted        Score:  1-7 Mild 8-19 Moderate 20-35 Severe   His SHIM score is 13, which is mild to moderate ED.   He has been having difficulty with erections for several months.  His major complaint is maintaining an erection.  His libido is diminished.   His risk factors for ED are age, hypogonadism, HLD, hypothyroidism, anxiety and depression.  He denies any painful erections or curvatures with his erections.   He has not tried anything for his ED in the past.        SHIM    Row Name 06/23/16 1113         SHIM: Over the last 6 months:   How do you rate your confidence that you could get and keep an erection? Low     When you had erections with sexual stimulation, how often were your erections hard enough for penetration (entering your partner)? A Few Times (much less than half the time)     During sexual intercourse, how often were you able to maintain your erection after you had penetrated (entered) your partner? Very Difficult     During sexual intercourse, how difficult was it to  maintain your erection to completion of intercourse? Slightly Difficult     When you attempted sexual intercourse, how often was it satisfactory for you? Difficult       SHIM Total Score   SHIM 13        Score: 1-7 Severe ED 8-11 Moderate ED 12-16 Mild-Moderate ED 17-21 Mild ED 22-25 No ED     PMH: Past Medical History:  Diagnosis Date  . Anxiety   . GERD (gastroesophageal reflux disease)   . History of nephrolithiasis   . Hyperlipidemia   . Hypothyroidism   . Low testosterone     Surgical History: History reviewed. No pertinent surgical history.  Home Medications:    Medication List       Accurate as of 06/23/16 11:45 AM. Always use your most recent med list.          levothyroxine 112 MCG tablet Commonly known as:  SYNTHROID, LEVOTHROID TAKE 1 TABLET BY MOUTH EVERY DAY   LORazepam 1 MG tablet Commonly known as:  ATIVAN Take 1 tablet (1 mg total) by mouth daily as needed for anxiety.       Allergies: No Known Allergies  Family History: Family History  Problem Relation Age of Onset  . Mental illness Mother   . Crohn's disease Mother   . Cancer Father     prostate  . Stroke Maternal Grandmother   . Bladder Cancer Neg Hx   . Kidney cancer Neg Hx     Social History:  reports that he has never smoked. He has never used smokeless tobacco. He reports that he does not drink alcohol or use drugs.  ROS: UROLOGY Frequent Urination?: No Hard to postpone urination?: No Burning/pain with urination?: No Get up at night to urinate?: No Leakage of urine?: No Urine stream starts and stops?: No Trouble starting stream?: No Do you have to strain to urinate?: No Blood in urine?: No Urinary tract infection?: No Sexually transmitted disease?: No Injury to kidneys or bladder?: No Painful intercourse?: No Weak stream?: No Erection problems?: Yes Penile pain?: No  Gastrointestinal Nausea?: No Vomiting?: No Indigestion/heartburn?: No Diarrhea?:  No Constipation?: No  Constitutional Fever: No Night  sweats?: No Weight loss?: No Fatigue?: Yes  Skin Skin rash/lesions?: No Itching?: No  Eyes Blurred vision?: No Double vision?: No  Ears/Nose/Throat Sore throat?: No Sinus problems?: No  Hematologic/Lymphatic Swollen glands?: No Easy bruising?: No  Cardiovascular Leg swelling?: No Chest pain?: No  Respiratory Cough?: No Shortness of breath?: No  Endocrine Excessive thirst?: No  Musculoskeletal Back pain?: No Joint pain?: No  Neurological Headaches?: No Dizziness?: Yes  Psychologic Depression?: No Anxiety?: Yes  Physical Exam: BP 113/72 (BP Location: Left Arm, Patient Position: Sitting, Cuff Size: Normal)   Pulse (!) 103   Ht 5' 5.1" (1.654 m)   Wt 201 lb 11.2 oz (91.5 kg)   BMI 33.46 kg/m   Constitutional: Well nourished. Alert and oriented, No acute distress. HEENT: [place] AT, moist mucus membranes. Trachea midline, no masses. Cardiovascular: No clubbing, cyanosis, or edema. Respiratory: Normal respiratory effort, no increased work of breathing. GI: Abdomen is soft, non tender, non distended, no abdominal masses. Liver and spleen not palpable.  No hernias appreciated.  Stool sample for occult testing is not indicated.   GU: No CVA tenderness.  No bladder fullness or masses.  Patient with circumcised phallus.  Urethral meatus is patent.  No penile discharge. No penile lesions or rashes. Scrotum without lesions, cysts, rashes and/or edema.  Testicles are located scrotally bilaterally. No masses are appreciated in the testicles. Left and right epididymis are normal. Rectal: Patient with  normal sphincter tone. Anus and perineum without scarring or rashes. No rectal masses are appreciated. Prostate is approximately 35 grams, no nodules are appreciated. Seminal vesicles are normal. Skin: No rashes, bruises or suspicious lesions. Lymph: No cervical or inguinal adenopathy. Neurologic: Grossly intact, no focal  deficits, moving all 4 extremities. Psychiatric: Normal mood and affect.  Laboratory Data: Lab Results  Component Value Date   WBC WILL FOLLOW 03/23/2016   HCT WILL FOLLOW 03/23/2016   MCV WILL FOLLOW 03/23/2016   PLT WILL FOLLOW 03/23/2016    Lab Results  Component Value Date   CREATININE 1.20 04/23/2016    Lab Results  Component Value Date   TESTOSTERONE 259 (L) 04/23/2016     Lab Results  Component Value Date   TSH 6.880 (H) 04/23/2016       Component Value Date/Time   CHOL 186 04/23/2016 0859   VLDL 51 (H) 04/23/2016 0859    Lab Results  Component Value Date   AST 54 (H) 04/23/2016   Lab Results  Component Value Date   ALT 97 (H) 04/23/2016    Assessment & Plan:    1. Hypogonadism  - I explained to patient that the current recommendations from the Endocrine Society reports the diagnosis of hypogonadism requires a serum total testosterone level obtained between 8 and 10 AM at least 2 days apart that is below the laboratory parameters  for normal testosterone.   - At this time, the patient does meet this requirement.    -I discussed with the patient the side effects of testosterone therapy, such as: enlargement of the prostate gland that may in turn cause LUTS, possible increased risk of PCa, DVT's and/or PE's, possible increased risk of heart attack or stroke, lower sperm count, swelling of the ankles, feet, or body, with or without heart failure, enlarged or painful breasts, have problems breathing while you sleep (sleep apnea), increased prostate specific antigen, mood swings, hypertension and increased red blood cell count.  - Patient not a candidate for Clomid due to abnormal LFT's  - He is  interested in trying Testopel-he filled out the reimbursement form at today's visit   2. Erectile dysfunction:  SHIM score is 13.   I explained to the patient that in order to achieve an erection it takes good functioning of the nervous system (parasympathetic,  sympathetic, sensory and motor), good blood flow into the erectile tissue of the penis and a desire to have sex.   I stated that conditions like diabetes, hypertension, coronary artery disease, peripheral vascular disease, smoking, alcohol consumption, age and BPH can diminish the ability to have an erection.   We discussed trying a PDE5 inhibitor, intra-urethral suppositories, intracavernous vasoactive drug injection therapy, vacuum constriction device and penile prosthesis implantation.    - Samples of Viagra 100 mg, # 3 and Stendra 200 mg, #3 given to the patient  - RTC in 6 months for repeat SHIM score and exam, as testosterone therapy can affect erectile function  3. Family history of prostate cancer  - IPSS score is 0/0  - Will need to obtain a PSA prior to initiating testosterone therapy  - Will start prostate cancer screening at this time with yearly PSA's, DRE's and IPSS scores- every 6 months if patient starts testosterone therapy   Return for pending Testopel reimbursement.  These notes generated with voice recognition software. I apologize for typographical errors.  Michiel Cowboy, PA-C  [facility] [address] [place], [place] [phone]

## 2016-06-27 ENCOUNTER — Other Ambulatory Visit: Payer: Self-pay | Admitting: Family Medicine

## 2016-06-28 ENCOUNTER — Telehealth: Payer: Self-pay | Admitting: Urology

## 2016-06-28 ENCOUNTER — Other Ambulatory Visit: Payer: Self-pay | Admitting: Family Medicine

## 2016-06-28 NOTE — Telephone Encounter (Signed)
LMOM for patient to call office back. [name] is approved and we can make an appointment for him. Per Carollee HerterShannon patient will need to come in and get a PSA level drawn before any Testosterone therapy can start.

## 2016-06-28 NOTE — Telephone Encounter (Signed)
rx

## 2016-06-28 NOTE — Telephone Encounter (Signed)
When you speak to the patient regarding his Testopel reimbursement, we also need to get a PSA on him before we start any testosterone therapy.

## 2016-06-29 ENCOUNTER — Telehealth: Payer: Self-pay | Admitting: *Deleted

## 2016-06-29 NOTE — Telephone Encounter (Signed)
LMOM- will send a letter.  

## 2016-06-29 NOTE — Telephone Encounter (Signed)
LMOM for patient to call office back. 

## 2016-06-29 NOTE — Telephone Encounter (Signed)
Spoke with patient and he states he is still looking into what kind of treatment he wants. Patient to call me if he chooses to get Testopel in the future. Patient is agreeable to come and have a psa drawn on the lab schedule because of family history. Patient transferred up front to make lab appointment.

## 2016-07-08 ENCOUNTER — Other Ambulatory Visit: Payer: Self-pay

## 2016-07-08 DIAGNOSIS — Z8042 Family history of malignant neoplasm of prostate: Secondary | ICD-10-CM

## 2016-07-09 ENCOUNTER — Other Ambulatory Visit: Payer: 59

## 2016-07-09 DIAGNOSIS — Z8042 Family history of malignant neoplasm of prostate: Secondary | ICD-10-CM

## 2016-07-10 LAB — PSA: PROSTATE SPECIFIC AG, SERUM: 0.9 ng/mL (ref 0.0–4.0)

## 2016-07-12 ENCOUNTER — Telehealth: Payer: Self-pay

## 2016-07-12 NOTE — Telephone Encounter (Signed)
LMOM

## 2016-07-12 NOTE — Telephone Encounter (Signed)
Spoke with pt in reference to PSA results. Pt stated that he still has not decided which/if he wants testosterone therapy. Pt voiced understanding.

## 2016-07-12 NOTE — Telephone Encounter (Signed)
-----   Message from Harle BattiestShannon A McGowan, PA-C sent at 07/10/2016 12:25 PM EDT ----- PSA is normal.  Suggest at least yearly screenings or every 6 months if he decides to go forward with testosterone therapy.

## 2016-08-23 ENCOUNTER — Encounter: Payer: 59 | Admitting: Family Medicine

## 2016-09-19 ENCOUNTER — Other Ambulatory Visit: Payer: Self-pay | Admitting: Family Medicine

## 2016-09-20 NOTE — Telephone Encounter (Signed)
Routing to provider. Appt on 10/20/16

## 2016-10-13 ENCOUNTER — Encounter: Payer: 59 | Admitting: Family Medicine

## 2016-10-20 ENCOUNTER — Encounter: Payer: Self-pay | Admitting: Family Medicine

## 2016-10-20 ENCOUNTER — Ambulatory Visit (INDEPENDENT_AMBULATORY_CARE_PROVIDER_SITE_OTHER): Payer: 59 | Admitting: Family Medicine

## 2016-10-20 VITALS — BP 116/79 | HR 89 | Temp 97.9°F | Ht 65.0 in | Wt 202.0 lb

## 2016-10-20 DIAGNOSIS — K760 Fatty (change of) liver, not elsewhere classified: Secondary | ICD-10-CM | POA: Diagnosis not present

## 2016-10-20 DIAGNOSIS — E291 Testicular hypofunction: Secondary | ICD-10-CM

## 2016-10-20 DIAGNOSIS — F331 Major depressive disorder, recurrent, moderate: Secondary | ICD-10-CM | POA: Diagnosis not present

## 2016-10-20 DIAGNOSIS — E039 Hypothyroidism, unspecified: Secondary | ICD-10-CM | POA: Diagnosis not present

## 2016-10-20 DIAGNOSIS — Z0000 Encounter for general adult medical examination without abnormal findings: Secondary | ICD-10-CM | POA: Diagnosis not present

## 2016-10-20 DIAGNOSIS — E7800 Pure hypercholesterolemia, unspecified: Secondary | ICD-10-CM

## 2016-10-20 LAB — URINALYSIS, ROUTINE W REFLEX MICROSCOPIC
BILIRUBIN UA: NEGATIVE
GLUCOSE, UA: NEGATIVE
KETONES UA: NEGATIVE
Leukocytes, UA: NEGATIVE
NITRITE UA: NEGATIVE
Specific Gravity, UA: 1.02 (ref 1.005–1.030)
UUROB: 0.2 mg/dL (ref 0.2–1.0)
pH, UA: 6 (ref 5.0–7.5)

## 2016-10-20 LAB — MICROSCOPIC EXAMINATION
Bacteria, UA: NONE SEEN
RBC MICROSCOPIC, UA: NONE SEEN /HPF (ref 0–?)

## 2016-10-20 MED ORDER — LEVOTHYROXINE SODIUM 112 MCG PO TABS: 112.0000 ug | ORAL_TABLET | Freq: Every day | ORAL | 4 refills | Status: DC

## 2016-10-20 MED ORDER — FLUOXETINE HCL 20 MG PO TABS: 20.0000 mg | ORAL_TABLET | Freq: Every day | ORAL | 3 refills | Status: DC

## 2016-10-20 NOTE — Assessment & Plan Note (Signed)
Discuss testosterone replacement and energy patient will consider with appropriate monitoring.

## 2016-10-20 NOTE — Assessment & Plan Note (Signed)
The current medical regimen is effective;  continue present plan and medications.  

## 2016-10-20 NOTE — Assessment & Plan Note (Signed)
Patient on cholesterol medicine will check today to see where he is.

## 2016-10-20 NOTE — Assessment & Plan Note (Signed)
Discussed care diet exercise nutrition we'll reassess liver function and cholesterol medications.

## 2016-10-20 NOTE — Progress Notes (Signed)
BP 116/79   Pulse 89   Temp 97.9 F (36.6 C)   Ht 5\' 5"  (1.651 m)   Wt 202 lb (91.6 kg)   SpO2 98%   BMI 33.61 kg/m    Subjective:    Patient ID: Michael Gillespie, male    DOB: [birth year], 51 y.o.   MRN: [id number]  HPI: Michael Gillespie is a 51 y.o. male  Chief Complaint  Patient presents with  . Annual Exam  Follow-up biggest problems is ongoing fatigue. Patient's had workup with urology with testosterone which is on the low side of normal and not felt to be his biggest problem. On review patient has tremendous amount of stress both at home and work. Limited sleep with sometimes 5 hours of sleep at night. Reviewed patient with diagnosis of fatty liver from GI. Taking thyroid medications faithfully without problems.  Relevant past medical, surgical, family and social history reviewed and updated as indicated. Interim medical history since our last visit reviewed. Allergies and medications reviewed and updated.  Review of Systems  Constitutional: Negative.   HENT: Negative.   Eyes: Negative.   Respiratory: Negative.   Cardiovascular: Negative.   Gastrointestinal: Negative.   Endocrine: Negative.   Genitourinary: Negative.   Musculoskeletal: Negative.   Skin: Negative.   Allergic/Immunologic: Negative.   Neurological: Negative.   Hematological: Negative.   Psychiatric/Behavioral: Negative.     Per HPI unless specifically indicated above     Objective:    BP 116/79   Pulse 89   Temp 97.9 F (36.6 C)   Ht 5\' 5"  (1.651 m)   Wt 202 lb (91.6 kg)   SpO2 98%   BMI 33.61 kg/m   Wt Readings from Last 3 Encounters:  10/20/16 202 lb (91.6 kg)  06/23/16 201 lb 11.2 oz (91.5 kg)  04/23/16 197 lb (89.4 kg)    Physical Exam  Constitutional: He is oriented to person, place, and time. He appears well-developed and well-nourished.  HENT:  Head: Normocephalic and atraumatic.  Right Ear: External ear normal.  Left Ear: External ear normal.  Eyes: Conjunctivae and EOM  are normal. Pupils are equal, round, and reactive to light.  Neck: Normal range of motion. Neck supple.  Cardiovascular: Normal rate, regular rhythm, normal heart sounds and intact distal pulses.   Pulmonary/Chest: Effort normal and breath sounds normal.  Abdominal: Soft. Bowel sounds are normal. There is no splenomegaly or hepatomegaly.  Genitourinary: Rectum normal, prostate normal and penis normal.  Musculoskeletal: Normal range of motion.  Neurological: He is alert and oriented to person, place, and time. He has normal reflexes.  Skin: No rash noted. No erythema.  Psychiatric: He has a normal mood and affect. His behavior is normal. Judgment and thought content normal.    Results for orders placed or performed in visit on 07/09/16  PSA  Result Value Ref Range   Prostate Specific Ag, Serum 0.9 0.0 - 4.0 ng/mL      Assessment & Plan:   Problem List Items Addressed This Visit      Digestive   Fatty liver disease, nonalcoholic    Discussed care diet exercise nutrition we'll reassess liver function and cholesterol medications.        Endocrine   Hypothyroidism    The current medical regimen is effective;  continue present plan and medications.       Relevant Medications   levothyroxine (SYNTHROID, LEVOTHROID) 112 MCG tablet   Other Relevant Orders   TSH   Hypogonadism  in male    Discuss testosterone replacement and energy patient will consider with appropriate monitoring.        Other   Hyperlipidemia    Patient on cholesterol medicine will check today to see where he is.      Relevant Orders   Lipid panel   Depression    Discussed depression care and treatment will start fluoxetine 20 mg recheck 2 weeks      Relevant Medications   FLUoxetine (PROZAC) 20 MG tablet    Other Visit Diagnoses    PE (physical exam), annual    -  Primary   Relevant Orders   Comprehensive metabolic panel   CBC with Differential/Platelet   Urinalysis, Routine w reflex microscopic     PSA       Follow up plan: Return in about 4 weeks (around 11/17/2016), or if symptoms worsen or fail to improve.

## 2016-10-20 NOTE — Assessment & Plan Note (Signed)
Discussed depression care and treatment will start fluoxetine 20 mg recheck 2 weeks 

## 2016-10-21 ENCOUNTER — Telehealth: Payer: Self-pay

## 2016-10-21 LAB — CBC WITH DIFFERENTIAL/PLATELET
BASOS: 0 %
Basophils Absolute: 0 10*3/uL (ref 0.0–0.2)
EOS (ABSOLUTE): 0.3 10*3/uL (ref 0.0–0.4)
EOS: 4 %
HEMATOCRIT: 42.2 % (ref 37.5–51.0)
HEMOGLOBIN: 14.2 g/dL (ref 13.0–17.7)
Immature Grans (Abs): 0 10*3/uL (ref 0.0–0.1)
Immature Granulocytes: 1 %
LYMPHS ABS: 2.3 10*3/uL (ref 0.7–3.1)
Lymphs: 32 %
MCH: 29.8 pg (ref 26.6–33.0)
MCHC: 33.6 g/dL (ref 31.5–35.7)
MCV: 89 fL (ref 79–97)
MONOCYTES: 9 %
MONOS ABS: 0.6 10*3/uL (ref 0.1–0.9)
Neutrophils Absolute: 3.9 10*3/uL (ref 1.4–7.0)
Neutrophils: 54 %
Platelets: 184 10*3/uL (ref 150–379)
RBC: 4.76 x10E6/uL (ref 4.14–5.80)
RDW: 13.9 % (ref 12.3–15.4)
WBC: 7.2 10*3/uL (ref 3.4–10.8)

## 2016-10-21 LAB — COMPREHENSIVE METABOLIC PANEL
ALK PHOS: 64 IU/L (ref 39–117)
ALT: 138 IU/L — AB (ref 0–44)
AST: 78 IU/L — AB (ref 0–40)
Albumin/Globulin Ratio: 1.6 (ref 1.2–2.2)
Albumin: 4.6 g/dL (ref 3.5–5.5)
BILIRUBIN TOTAL: 0.3 mg/dL (ref 0.0–1.2)
BUN/Creatinine Ratio: 13 (ref 9–20)
BUN: 17 mg/dL (ref 6–24)
CHLORIDE: 99 mmol/L (ref 96–106)
CO2: 24 mmol/L (ref 18–29)
Calcium: 9.2 mg/dL (ref 8.7–10.2)
Creatinine, Ser: 1.35 mg/dL — ABNORMAL HIGH (ref 0.76–1.27)
GFR calc Af Amer: 71 mL/min/{1.73_m2} (ref >59)
GFR calc non Af Amer: 61 mL/min/{1.73_m2} (ref >59)
GLUCOSE: 102 mg/dL — AB (ref 65–99)
Globulin, Total: 2.9 g/dL (ref 1.5–4.5)
POTASSIUM: 4.4 mmol/L (ref 3.5–5.2)
Sodium: 140 mmol/L (ref 134–144)
TOTAL PROTEIN: 7.5 g/dL (ref 6.0–8.5)

## 2016-10-21 LAB — LIPID PANEL
CHOLESTEROL TOTAL: 241 mg/dL — AB (ref 100–199)
Chol/HDL Ratio: 7.3 ratio units — ABNORMAL HIGH (ref 0.0–5.0)
HDL: 33 mg/dL — AB (ref >39)
TRIGLYCERIDES: 410 mg/dL — AB (ref 0–149)

## 2016-10-21 LAB — TSH: TSH: 6.06 u[IU]/mL — AB (ref 0.450–4.500)

## 2016-10-21 LAB — PSA: Prostate Specific Ag, Serum: 1.1 ng/mL (ref 0.0–4.0)

## 2016-10-21 NOTE — Progress Notes (Signed)
Attempted to reach

## 2016-10-21 NOTE — Telephone Encounter (Signed)
-----   Message from Steele SizerMark A Crissman, MD sent at 10/21/2016 12:25 PM EST ----- Call pt about labs

## 2016-10-25 ENCOUNTER — Telehealth: Payer: Self-pay | Admitting: Family Medicine

## 2016-10-25 DIAGNOSIS — R7989 Other specified abnormal findings of blood chemistry: Secondary | ICD-10-CM

## 2016-10-25 DIAGNOSIS — E039 Hypothyroidism, unspecified: Secondary | ICD-10-CM

## 2016-10-25 DIAGNOSIS — R945 Abnormal results of liver function studies: Principal | ICD-10-CM

## 2016-10-25 DIAGNOSIS — E7800 Pure hypercholesterolemia, unspecified: Secondary | ICD-10-CM

## 2016-10-25 NOTE — Telephone Encounter (Signed)
Patient called to check on his labs.  Thanks  Clydie BraunKaren  [phone]

## 2016-10-25 NOTE — Telephone Encounter (Signed)
Phone call Discussed with patient TSH remains elevated will increase thyroid medicine from 112 to 25. Reviewed patient taking medications appropriately. Review cholesterol still elevated triglycerides elevated Liver enzymes still elevated patient not drinking alcohol most likely fatty liver. Discuss increasing thyroid may help discuss weight loss Patient coming back in 2 months We will check lipid panel, ALT, AST and TSH.

## 2016-11-01 ENCOUNTER — Other Ambulatory Visit: Payer: Self-pay | Admitting: Family Medicine

## 2016-11-01 MED ORDER — LEVOTHYROXINE SODIUM 125 MCG PO TABS: 125.0000 ug | ORAL_TABLET | Freq: Every day | ORAL | 1 refills | Status: DC

## 2016-11-01 NOTE — Telephone Encounter (Signed)
Patient said Dr Dossie Arbourrissman was going to call in a change in his dosage of Levothyroxine from 112mcg to he thought 125mcg. He is going out of town this week.  [facility] [facility]  Thank [place] RecordsYou Karen  [phone]

## 2016-11-01 NOTE — Telephone Encounter (Signed)
Per telephone note from 10/25/16, "Discussed with patient TSH remains elevated will increase thyroid medicine from 112 to 25."  Levothyroxine 125 MCG was not sent to pharmacy at that time. Medication T'd up for review.

## 2016-11-23 ENCOUNTER — Encounter: Payer: Self-pay | Admitting: Family Medicine

## 2016-11-23 ENCOUNTER — Ambulatory Visit (INDEPENDENT_AMBULATORY_CARE_PROVIDER_SITE_OTHER): Payer: 59 | Admitting: Family Medicine

## 2016-11-23 VITALS — BP 120/83 | HR 84 | Ht 65.0 in | Wt 201.6 lb

## 2016-11-23 DIAGNOSIS — R7989 Other specified abnormal findings of blood chemistry: Secondary | ICD-10-CM | POA: Diagnosis not present

## 2016-11-23 DIAGNOSIS — R945 Abnormal results of liver function studies: Secondary | ICD-10-CM

## 2016-11-23 DIAGNOSIS — E7800 Pure hypercholesterolemia, unspecified: Secondary | ICD-10-CM

## 2016-11-23 DIAGNOSIS — K760 Fatty (change of) liver, not elsewhere classified: Secondary | ICD-10-CM | POA: Diagnosis not present

## 2016-11-23 DIAGNOSIS — E039 Hypothyroidism, unspecified: Secondary | ICD-10-CM | POA: Diagnosis not present

## 2016-11-23 LAB — LP+ALT+AST PICCOLO, WAIVED
ALT (SGPT) Piccolo, Waived: 68 U/L — ABNORMAL HIGH (ref 10–47)
AST (SGOT) Piccolo, Waived: 34 U/L (ref 11–38)
CHOL/HDL RATIO PICCOLO,WAIVE: 5.1 mg/dL — AB
Cholesterol Piccolo, Waived: 195 mg/dL (ref <200)
HDL Chol Piccolo, Waived: 38 mg/dL — ABNORMAL LOW (ref >59)
LDL CHOL CALC PICCOLO WAIVED: 96 mg/dL (ref <100)
TRIGLYCERIDES PICCOLO,WAIVED: 302 mg/dL — AB (ref <150)
VLDL Chol Calc Piccolo,Waive: 60 mg/dL — ABNORMAL HIGH (ref <30)

## 2016-11-23 MED ORDER — FLUOXETINE HCL 20 MG PO TABS: 20.0000 mg | ORAL_TABLET | Freq: Every day | ORAL | 6 refills | Status: DC

## 2016-11-23 MED ORDER — LORAZEPAM 1 MG PO TABS: 1.0000 mg | ORAL_TABLET | Freq: Every day | ORAL | 1 refills | Status: DC | PRN

## 2016-11-23 NOTE — Assessment & Plan Note (Signed)
Significant improvement with better diet and nutrition

## 2016-11-23 NOTE — Assessment & Plan Note (Signed)
Significant improvement with diet and fish oil will continue same

## 2016-11-23 NOTE — Assessment & Plan Note (Signed)
Patient taking medicines faithfully will await results

## 2016-11-23 NOTE — Progress Notes (Signed)
BP 120/83   Pulse 84   Ht 5\' 5"  (1.651 m)   Wt 201 lb 9.6 oz (91.4 kg)   SpO2 99%   BMI 33.55 kg/m    Subjective:    Patient ID: Michael Gillespie, male    DOB: Jul 07, 1966, 51 y.o.   MRN: [id number]  HPI: Michael Gillespie is a 51 y.o. male  Chief Complaint  Patient presents with  . Follow-up  . Depression   Patient follow-up depression doing well taking fluoxetine without problems or issues lorazepam somewhat when necessary basis for anxiety try not to take every day and all in all doing well. Trying to eat better with healthier diet for fatty liver and elevated liver enzymes elevated cholesterol taking fish oil and milk thistle. All in all doing well. Relevant past medical, surgical, family and social history reviewed and updated as indicated. Interim medical history since our last visit reviewed. Allergies and medications reviewed and updated.  Review of Systems  Constitutional: Negative.   Respiratory: Negative.   Cardiovascular: Negative.     Per HPI unless specifically indicated above     Objective:    BP 120/83   Pulse 84   Ht 5\' 5"  (1.651 m)   Wt 201 lb 9.6 oz (91.4 kg)   SpO2 99%   BMI 33.55 kg/m   Wt Readings from Last 3 Encounters:  11/23/16 201 lb 9.6 oz (91.4 kg)  10/20/16 202 lb (91.6 kg)  06/23/16 201 lb 11.2 oz (91.5 kg)    Physical Exam  Constitutional: He is oriented to person, place, and time. He appears well-developed and well-nourished. No distress.  HENT:  Head: Normocephalic and atraumatic.  Right Ear: Hearing normal.  Left Ear: Hearing normal.  Nose: Nose normal.  Eyes: Conjunctivae and lids are normal. Right eye exhibits no discharge. Left eye exhibits no discharge. No scleral icterus.  Cardiovascular: Normal rate, regular rhythm and normal heart sounds.   Pulmonary/Chest: Effort normal and breath sounds normal. No respiratory distress.  Abdominal: Soft. Bowel sounds are normal. He exhibits no mass.  Musculoskeletal: Normal range of  motion.  Neurological: He is alert and oriented to person, place, and time.  Skin: Skin is intact. No rash noted.  Psychiatric: He has a normal mood and affect. His speech is normal and behavior is normal. Judgment and thought content normal. Cognition and memory are normal.    Results for orders placed or performed in visit on 10/20/16  Microscopic Examination  Result Value Ref Range   WBC, UA 0-5 0 - 5 /hpf   RBC, UA None seen 0 - 2 /hpf   Epithelial Cells (non renal) 0-10 0 - 10 /hpf   Mucus, UA Present (A) Not Estab.   Bacteria, UA None seen None seen/Few  TSH  Result Value Ref Range   TSH 6.060 (H) 0.450 - 4.500 uIU/mL  Comprehensive metabolic panel  Result Value Ref Range   Glucose 102 (H) 65 - 99 mg/dL   BUN 17 6 - 24 mg/dL   Creatinine, Ser 4.091.35 (H) 0.76 - 1.27 mg/dL   GFR calc non Af Amer 61 >59 mL/min/1.73   GFR calc Af Amer 71 >59 mL/min/1.73   BUN/Creatinine Ratio 13 9 - 20   Sodium 140 134 - 144 mmol/L   Potassium 4.4 3.5 - 5.2 mmol/L   Chloride 99 96 - 106 mmol/L   CO2 24 18 - 29 mmol/L   Calcium 9.2 8.7 - 10.2 mg/dL   Total Protein 7.5  6.0 - 8.5 g/dL   Albumin 4.6 3.5 - 5.5 g/dL   Globulin, Total 2.9 1.5 - 4.5 g/dL   Albumin/Globulin Ratio 1.6 1.2 - 2.2   Bilirubin Total 0.3 0.0 - 1.2 mg/dL   Alkaline Phosphatase 64 39 - 117 IU/L   AST 78 (H) 0 - 40 IU/L   ALT 138 (H) 0 - 44 IU/L  Lipid panel  Result Value Ref Range   Cholesterol, Total 241 (H) 100 - 199 mg/dL   Triglycerides 161 (H) 0 - 149 mg/dL   HDL 33 (L) >09 mg/dL   VLDL Cholesterol Cal Comment 5 - 40 mg/dL   LDL Calculated Comment 0 - 99 mg/dL   Chol/HDL Ratio 7.3 (H) 0.0 - 5.0 ratio units  CBC with Differential/Platelet  Result Value Ref Range   WBC 7.2 3.4 - 10.8 x10E3/uL   RBC 4.76 4.14 - 5.80 x10E6/uL   Hemoglobin 14.2 13.0 - 17.7 g/dL   Hematocrit 60.4 54.0 - 51.0 %   MCV 89 79 - 97 fL   MCH 29.8 26.6 - 33.0 pg   MCHC 33.6 31.5 - 35.7 g/dL   RDW 98.1 19.1 - 47.8 %   Platelets 184 150  - 379 x10E3/uL   Neutrophils 54 Not Estab. %   Lymphs 32 Not Estab. %   Monocytes 9 Not Estab. %   Eos 4 Not Estab. %   Basos 0 Not Estab. %   Neutrophils Absolute 3.9 1.4 - 7.0 x10E3/uL   Lymphocytes Absolute 2.3 0.7 - 3.1 x10E3/uL   Monocytes Absolute 0.6 0.1 - 0.9 x10E3/uL   EOS (ABSOLUTE) 0.3 0.0 - 0.4 x10E3/uL   Basophils Absolute 0.0 0.0 - 0.2 x10E3/uL   Immature Granulocytes 1 Not Estab. %   Immature Grans (Abs) 0.0 0.0 - 0.1 x10E3/uL  Urinalysis, Routine w reflex microscopic  Result Value Ref Range   Specific Gravity, UA 1.020 1.005 - 1.030   pH, UA 6.0 5.0 - 7.5   Color, UA Yellow Yellow   Appearance Ur Clear Clear   Leukocytes, UA Negative Negative   Protein, UA Trace (A) Negative/Trace   Glucose, UA Negative Negative   Ketones, UA Negative Negative   RBC, UA Trace (A) Negative   Bilirubin, UA Negative Negative   Urobilinogen, Ur 0.2 0.2 - 1.0 mg/dL   Nitrite, UA Negative Negative   Microscopic Examination See below:   PSA  Result Value Ref Range   Prostate Specific Ag, Serum 1.1 0.0 - 4.0 ng/mL      Assessment & Plan:   Problem List Items Addressed This Visit      Digestive   Fatty liver disease, nonalcoholic    Significant improvement with better diet and nutrition        Endocrine   Hypothyroidism - Primary    Patient taking medicines faithfully will await results        Other   Hyperlipidemia    Significant improvement with diet and fish oil will continue same      Elevated LFTs       Follow up plan: Return for June, BMP,  Lipids, ALT, AST.

## 2016-11-24 ENCOUNTER — Encounter: Payer: Self-pay | Admitting: Family Medicine

## 2016-11-24 LAB — TSH: TSH: 2.61 u[IU]/mL (ref 0.450–4.500)

## 2017-01-12 ENCOUNTER — Other Ambulatory Visit: Payer: Self-pay | Admitting: Family Medicine

## 2017-01-12 NOTE — Telephone Encounter (Signed)
Your patient 

## 2017-03-02 DIAGNOSIS — H8110 Benign paroxysmal vertigo, unspecified ear: Secondary | ICD-10-CM | POA: Diagnosis not present

## 2017-03-03 ENCOUNTER — Ambulatory Visit (INDEPENDENT_AMBULATORY_CARE_PROVIDER_SITE_OTHER): Payer: 59 | Admitting: Family Medicine

## 2017-03-03 ENCOUNTER — Encounter: Payer: Self-pay | Admitting: Family Medicine

## 2017-03-03 DIAGNOSIS — E039 Hypothyroidism, unspecified: Secondary | ICD-10-CM

## 2017-03-03 DIAGNOSIS — H0520 Unspecified exophthalmos: Secondary | ICD-10-CM | POA: Diagnosis not present

## 2017-03-03 DIAGNOSIS — R42 Dizziness and giddiness: Secondary | ICD-10-CM

## 2017-03-03 MED ORDER — MECLIZINE HCL 25 MG PO TABS: 25.0000 mg | ORAL_TABLET | Freq: Three times a day (TID) | ORAL | 3 refills | Status: DC | PRN

## 2017-03-03 NOTE — Progress Notes (Signed)
BP 125/87   Pulse 97   Wt 203 lb (92.1 kg)   SpO2 98%   BMI 33.78 kg/m    Subjective:    Patient ID: Michael Gillespie, male    DOB: 10/26/1965, 51 y.o.   MRN: [id number]  HPI: Michael Gillespie is a 51 y.o. male  Chief Complaint  Patient presents with  . Dizziness  Patient with several months of intermittent dizziness sometimes comes on with position change especially when lying in bed and rolling over. Sometimes just comes on spontaneously without moving his head these spells last about 30-40 seconds then goes away. They seem to be getting worse. Has taken meclizine 12.5 one dose hasn't seemed to help.  Hypothyroid taking his thyroid medicine faithfully but is noticed his right eye is bulging more also left eye is bulging no visual changes or concerns.  Relevant past medical, surgical, family and social history reviewed and updated as indicated. Interim medical history since our last visit reviewed. Allergies and medications reviewed and updated.  Review of Systems  Constitutional: Negative.  Negative for fever.  HENT: Positive for tinnitus. Negative for congestion, dental problem, facial swelling, hearing loss, sinus pressure, sneezing, sore throat and voice change.   Eyes: Negative for photophobia, pain, discharge, redness, itching and visual disturbance.  Respiratory: Negative.   Cardiovascular: Negative.     Per HPI unless specifically indicated above     Objective:    BP 125/87   Pulse 97   Wt 203 lb (92.1 kg)   SpO2 98%   BMI 33.78 kg/m   Wt Readings from Last 3 Encounters:  03/03/17 203 lb (92.1 kg)  11/23/16 201 lb 9.6 oz (91.4 kg)  10/20/16 202 lb (91.6 kg)    Physical Exam  Results for orders placed or performed in visit on 11/23/16  TSH  Result Value Ref Range   TSH 2.610 0.450 - 4.500 uIU/mL  LP+ALT+AST Piccolo, Waived  Result Value Ref Range   ALT (SGPT) Piccolo, Waived 68 (H) 10 - 47 U/L   AST (SGOT) Piccolo, Waived 34 11 - 38 U/L   Cholesterol Piccolo, Waived 195 <200 mg/dL   HDL Chol Piccolo, Waived 38 (L) >59 mg/dL   Triglycerides Piccolo,Waived 302 (H) <150 mg/dL   Chol/HDL Ratio Piccolo,Waive 5.1 (H) mg/dL   LDL Chol Calc Piccolo Waived 96 <100 mg/dL   VLDL Chol Calc Piccolo,Waive 60 (H) <30 mg/dL      Assessment & Plan:   Problem List Items Addressed This Visit      Endocrine   Hypothyroidism    Patient with hypothyroid on thyroid replacement but with changes of exophthalmos and also vertigo will check thyroid function Pending results patient may need endocrinology referral      Relevant Orders   TSH   Comprehensive metabolic panel   CBC with Differential/Platelet   T4   Ambulatory referral to ENT     Other   Exophthalmos    Patient with prominent right eye exophthalmos will refer to ophthalmology to further evaluate and check thyroid function      Relevant Orders   TSH   Comprehensive metabolic panel   CBC with Differential/Platelet   Ambulatory referral to Ophthalmology   Vertigo    Not sure what role vertigo is planning with possible thyroid and eyes symptoms. Will refer to ENT to further evaluate his this is the patient's prominent symptom.      Relevant Medications   meclizine (ANTIVERT) 25 MG tablet  Other Relevant Orders   TSH   Comprehensive metabolic panel   CBC with Differential/Platelet   Ambulatory referral to ENT       Follow up plan: Return in about 4 weeks (around 03/31/2017) for recheck.

## 2017-03-03 NOTE — Assessment & Plan Note (Signed)
Not sure what role vertigo is planning with possible thyroid and eyes symptoms. Will refer to ENT to further evaluate his this is the patient's prominent symptom.

## 2017-03-03 NOTE — Assessment & Plan Note (Signed)
Patient with prominent right eye exophthalmos will refer to ophthalmology to further evaluate and check thyroid function

## 2017-03-03 NOTE — Assessment & Plan Note (Addendum)
Patient with hypothyroid on thyroid replacement but with changes of exophthalmos and also vertigo will check thyroid function Pending results patient may need endocrinology referral

## 2017-03-04 LAB — CBC WITH DIFFERENTIAL/PLATELET
BASOS ABS: 0 10*3/uL (ref 0.0–0.2)
Basos: 1 %
EOS (ABSOLUTE): 0.2 10*3/uL (ref 0.0–0.4)
Eos: 4 %
Hematocrit: 41.4 % (ref 37.5–51.0)
Hemoglobin: 14.5 g/dL (ref 13.0–17.7)
IMMATURE GRANS (ABS): 0 10*3/uL (ref 0.0–0.1)
IMMATURE GRANULOCYTES: 1 %
LYMPHS: 27 %
Lymphocytes Absolute: 1.6 10*3/uL (ref 0.7–3.1)
MCH: 30.5 pg (ref 26.6–33.0)
MCHC: 35 g/dL (ref 31.5–35.7)
MCV: 87 fL (ref 79–97)
Monocytes Absolute: 0.8 10*3/uL (ref 0.1–0.9)
Monocytes: 13 %
NEUTROS PCT: 54 %
Neutrophils Absolute: 3.3 10*3/uL (ref 1.4–7.0)
PLATELETS: 174 10*3/uL (ref 150–379)
RBC: 4.75 x10E6/uL (ref 4.14–5.80)
RDW: 14.2 % (ref 12.3–15.4)
WBC: 5.9 10*3/uL (ref 3.4–10.8)

## 2017-03-04 LAB — COMPREHENSIVE METABOLIC PANEL
A/G RATIO: 1.7 (ref 1.2–2.2)
ALT: 118 IU/L — AB (ref 0–44)
AST: 59 IU/L — AB (ref 0–40)
Albumin: 4.5 g/dL (ref 3.5–5.5)
Alkaline Phosphatase: 66 IU/L (ref 39–117)
BILIRUBIN TOTAL: 0.3 mg/dL (ref 0.0–1.2)
BUN/Creatinine Ratio: 14 (ref 9–20)
BUN: 16 mg/dL (ref 6–24)
CALCIUM: 9.3 mg/dL (ref 8.7–10.2)
CHLORIDE: 102 mmol/L (ref 96–106)
CO2: 23 mmol/L (ref 18–29)
Creatinine, Ser: 1.14 mg/dL (ref 0.76–1.27)
GFR calc Af Amer: 87 mL/min/{1.73_m2} (ref >59)
GFR calc non Af Amer: 75 mL/min/{1.73_m2} (ref >59)
GLUCOSE: 115 mg/dL — AB (ref 65–99)
Globulin, Total: 2.6 g/dL (ref 1.5–4.5)
POTASSIUM: 4.7 mmol/L (ref 3.5–5.2)
Sodium: 140 mmol/L (ref 134–144)
Total Protein: 7.1 g/dL (ref 6.0–8.5)

## 2017-03-04 LAB — TSH: TSH: 2.92 u[IU]/mL (ref 0.450–4.500)

## 2017-03-04 LAB — T4: T4 TOTAL: 7.9 ug/dL (ref 4.5–12.0)

## 2017-03-08 ENCOUNTER — Encounter: Payer: Self-pay | Admitting: Family Medicine

## 2017-03-09 ENCOUNTER — Telehealth: Payer: Self-pay | Admitting: Family Medicine

## 2017-03-09 NOTE — Telephone Encounter (Signed)
Pt wanted to know what his "action plan" was for elevated ALT and AST. Advised pt that they were lower 6 days ago then then were 4 months ago, and that I would forward on to provider for further recommendations/instructions.

## 2017-03-09 NOTE — Telephone Encounter (Signed)
Pt would like a call back regarding lab results. He has looked at them on MyChart but has further questions.

## 2017-03-09 NOTE — Telephone Encounter (Signed)
Left message on machine for pt to return call to the office.  

## 2017-03-10 NOTE — Telephone Encounter (Signed)
Left message on machine for pt to return call to the office.  

## 2017-03-10 NOTE — Telephone Encounter (Signed)
Call pt 

## 2017-03-20 ENCOUNTER — Other Ambulatory Visit: Payer: Self-pay | Admitting: Family Medicine

## 2017-03-21 NOTE — Telephone Encounter (Signed)
  Last routine OV: 03/03/17 Next OV: 04/07/17

## 2017-03-22 DIAGNOSIS — E0500 Thyrotoxicosis with diffuse goiter without thyrotoxic crisis or storm: Secondary | ICD-10-CM | POA: Diagnosis not present

## 2017-04-05 DIAGNOSIS — R42 Dizziness and giddiness: Secondary | ICD-10-CM | POA: Diagnosis not present

## 2017-04-05 DIAGNOSIS — R0683 Snoring: Secondary | ICD-10-CM | POA: Diagnosis not present

## 2017-04-05 DIAGNOSIS — J351 Hypertrophy of tonsils: Secondary | ICD-10-CM | POA: Diagnosis not present

## 2017-04-07 ENCOUNTER — Ambulatory Visit: Payer: 59 | Admitting: Family Medicine

## 2017-04-15 ENCOUNTER — Telehealth: Payer: Self-pay | Admitting: Family Medicine

## 2017-04-15 DIAGNOSIS — R42 Dizziness and giddiness: Secondary | ICD-10-CM | POA: Diagnosis not present

## 2017-04-16 ENCOUNTER — Other Ambulatory Visit: Payer: Self-pay | Admitting: Family Medicine

## 2017-04-18 MED ORDER — LORAZEPAM 1 MG PO TABS: 1.0000 mg | ORAL_TABLET | Freq: Every day | ORAL | 1 refills | Status: DC | PRN

## 2017-04-18 NOTE — Telephone Encounter (Signed)
Patient taking lorazepam for vertigo process working through specialists now. Will give refill for one more refill patient cautioned about controlled drugs.

## 2017-04-18 NOTE — Telephone Encounter (Signed)
Looks like this is your patient

## 2017-04-18 NOTE — Telephone Encounter (Signed)
Patient was transferred to provider for telephone conversation.   

## 2017-04-18 NOTE — Telephone Encounter (Signed)
Call pt 

## 2017-04-22 DIAGNOSIS — H8111 Benign paroxysmal vertigo, right ear: Secondary | ICD-10-CM | POA: Diagnosis not present

## 2017-04-22 DIAGNOSIS — H8110 Benign paroxysmal vertigo, unspecified ear: Secondary | ICD-10-CM | POA: Diagnosis not present

## 2017-05-08 DIAGNOSIS — G4733 Obstructive sleep apnea (adult) (pediatric): Secondary | ICD-10-CM | POA: Diagnosis not present

## 2017-05-24 ENCOUNTER — Ambulatory Visit (INDEPENDENT_AMBULATORY_CARE_PROVIDER_SITE_OTHER): Payer: 59 | Admitting: Family Medicine

## 2017-05-24 ENCOUNTER — Encounter: Payer: Self-pay | Admitting: Family Medicine

## 2017-05-24 VITALS — BP 109/77 | HR 93 | Resp 16 | Ht 65.0 in | Wt 192.6 lb

## 2017-05-24 DIAGNOSIS — K760 Fatty (change of) liver, not elsewhere classified: Secondary | ICD-10-CM | POA: Diagnosis not present

## 2017-05-24 DIAGNOSIS — R42 Dizziness and giddiness: Secondary | ICD-10-CM

## 2017-05-24 DIAGNOSIS — F419 Anxiety disorder, unspecified: Secondary | ICD-10-CM

## 2017-05-24 DIAGNOSIS — R945 Abnormal results of liver function studies: Principal | ICD-10-CM

## 2017-05-24 DIAGNOSIS — E291 Testicular hypofunction: Secondary | ICD-10-CM | POA: Diagnosis not present

## 2017-05-24 DIAGNOSIS — H0520 Unspecified exophthalmos: Secondary | ICD-10-CM | POA: Diagnosis not present

## 2017-05-24 DIAGNOSIS — R7989 Other specified abnormal findings of blood chemistry: Secondary | ICD-10-CM | POA: Diagnosis not present

## 2017-05-24 DIAGNOSIS — E039 Hypothyroidism, unspecified: Secondary | ICD-10-CM | POA: Diagnosis not present

## 2017-05-24 MED ORDER — CITALOPRAM HYDROBROMIDE 20 MG PO TABS
20.0000 mg | ORAL_TABLET | Freq: Every day | ORAL | 3 refills | Status: DC
Start: 2017-05-24 — End: 2017-08-03

## 2017-05-24 NOTE — Assessment & Plan Note (Signed)
Will check labs today with weight loss and ketone diet

## 2017-05-24 NOTE — Assessment & Plan Note (Signed)
Will check labs again 

## 2017-05-24 NOTE — Assessment & Plan Note (Signed)
Discussed different treatments available will start with citalopram patient's tried Prozac in the past without a lot of success.

## 2017-05-24 NOTE — Assessment & Plan Note (Signed)
The current medical regimen is effective;  continue present plan and medications.  

## 2017-05-24 NOTE — Assessment & Plan Note (Signed)
Vertigo now resolved.

## 2017-05-24 NOTE — Progress Notes (Signed)
BP 109/77   Pulse 93   Resp 16   Ht 5\' 5"  (1.651 m)   Wt 192 lb 9.6 oz (87.4 kg)   SpO2 99%   BMI 32.05 kg/m    Subjective:    Patient ID: Michael Gillespie, male    DOB: 1965-10-18, 51 y.o.   MRN: [id number]  HPI: Michael Gillespie is a 51 y.o. male  Chief Complaint  Patient presents with  . Hypothyroidism  . Anxiety    Has panic attacks over vertigo episodes.  Patient follow-up had multiple issues going on. 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 No further intervention was needed other than follow-up in one year. Vertigo has resolved with Hallpike maneuvers. Unfortunately anxiety related to driving especially on the highway remains and is been unable to drive on the highway as a consequence. Has tried some lorazepam to no real avail as anxiety has remained. Patient's been able to lose weight try and work on the fatty liver by doing the key to diet. Has been very successful with low carbohydrates and good weight loss concerned about cholesterol and liver function.  Relevant past medical, surgical, family and social history reviewed and updated as indicated. Interim medical history since our last visit reviewed. Allergies and medications reviewed and updated.  Review of Systems  Constitutional: Negative.   Respiratory: Negative.   Cardiovascular: Negative.     Per HPI unless specifically indicated above     Objective:    BP 109/77   Pulse 93   Resp 16   Ht 5\' 5"  (1.651 m)   Wt 192 lb 9.6 oz (87.4 kg)   SpO2 99%   BMI 32.05 kg/m   Wt Readings from Last 3 Encounters:  05/24/17 192 lb 9.6 oz (87.4 kg)  03/03/17 203 lb (92.1 kg)  11/23/16 201 lb 9.6 oz (91.4 kg)    Physical Exam  Constitutional: He is oriented to person, place, and time. He appears well-developed and well-nourished.  HENT:  Head: Normocephalic and atraumatic.  Eyes: Conjunctivae and EOM are normal.  Neck: Normal range of motion.    Cardiovascular: Normal rate, regular rhythm and normal heart sounds.   Pulmonary/Chest: Effort normal and breath sounds normal.  Musculoskeletal: Normal range of motion.  Neurological: He is alert and oriented to person, place, and time.  Skin: No erythema.  Psychiatric: He has a normal mood and affect. His behavior is normal. Judgment and thought content normal.    Results for orders placed or performed in visit on 03/03/17  TSH  Result Value Ref Range   TSH 2.920 0.450 - 4.500 uIU/mL  Comprehensive metabolic panel  Result Value Ref Range   Glucose 115 (H) 65 - 99 mg/dL   BUN 16 6 - 24 mg/dL   Creatinine, Ser 4.091.14 0.76 - 1.27 mg/dL   GFR calc non Af Amer 75 >59 mL/min/1.73   GFR calc Af Amer 87 >59 mL/min/1.73   BUN/Creatinine Ratio 14 9 - 20   Sodium 140 134 - 144 mmol/L   Potassium 4.7 3.5 - 5.2 mmol/L   Chloride 102 96 - 106 mmol/L   CO2 23 18 - 29 mmol/L   Calcium 9.3 8.7 - 10.2 mg/dL   Total Protein 7.1 6.0 - 8.5 g/dL   Albumin 4.5 3.5 - 5.5 g/dL   Globulin, Total 2.6 1.5 - 4.5 g/dL   Albumin/Globulin Ratio 1.7 1.2 - 2.2   Bilirubin Total 0.3 0.0 - 1.2 mg/dL  Alkaline Phosphatase 66 39 - 117 IU/L   AST 59 (H) 0 - 40 IU/L   ALT 118 (H) 0 - 44 IU/L  CBC with Differential/Platelet  Result Value Ref Range   WBC 5.9 3.4 - 10.8 x10E3/uL   RBC 4.75 4.14 - 5.80 x10E6/uL   Hemoglobin 14.5 13.0 - 17.7 g/dL   Hematocrit 16.1 09.6 - 51.0 %   MCV 87 79 - 97 fL   MCH 30.5 26.6 - 33.0 pg   MCHC 35.0 31.5 - 35.7 g/dL   RDW 04.5 40.9 - 81.1 %   Platelets 174 150 - 379 x10E3/uL   Neutrophils 54 Not Estab. %   Lymphs 27 Not Estab. %   Monocytes 13 Not Estab. %   Eos 4 Not Estab. %   Basos 1 Not Estab. %   Neutrophils Absolute 3.3 1.4 - 7.0 x10E3/uL   Lymphocytes Absolute 1.6 0.7 - 3.1 x10E3/uL   Monocytes Absolute 0.8 0.1 - 0.9 x10E3/uL   EOS (ABSOLUTE) 0.2 0.0 - 0.4 x10E3/uL   Basophils Absolute 0.0 0.0 - 0.2 x10E3/uL   Immature Granulocytes 1 Not Estab. %   Immature  Grans (Abs) 0.0 0.0 - 0.1 x10E3/uL  T4  Result Value Ref Range   T4, Total 7.9 4.5 - 12.0 ug/dL      Assessment & Plan:   Problem List Items Addressed This Visit      Digestive   Fatty liver disease, nonalcoholic    Will check labs today with weight loss and ketone diet      Relevant Orders   Comprehensive metabolic panel   Lipid panel     Endocrine   Hypothyroidism    The current medical regimen is effective;  continue present plan and medications.       Relevant Orders   Lipid panel   Hypogonadism in male    Will check labs again      Relevant Orders   Comprehensive metabolic panel   Testosterone     Other   Anxiety    Discussed different treatments available will start with citalopram patient's tried Prozac in the past without a lot of success.      Relevant Medications   citalopram (CELEXA) 20 MG tablet   Elevated LFTs - Primary   Relevant Orders   Comprehensive metabolic panel   Lipid panel   Exophthalmos    The current medical regimen is effective;  continue present plan and medications.       Vertigo    Vertigo now resolved.          Follow up plan: Return in about 4 weeks (around 06/21/2017).

## 2017-05-25 ENCOUNTER — Encounter: Payer: Self-pay | Admitting: Family Medicine

## 2017-05-25 LAB — LIPID PANEL
CHOLESTEROL TOTAL: 201 mg/dL — AB (ref 100–199)
Chol/HDL Ratio: 7.7 ratio — ABNORMAL HIGH (ref 0.0–5.0)
HDL: 26 mg/dL — AB (ref >39)
LDL Calculated: 109 mg/dL — ABNORMAL HIGH (ref 0–99)
TRIGLYCERIDES: 332 mg/dL — AB (ref 0–149)
VLDL Cholesterol Cal: 66 mg/dL — ABNORMAL HIGH (ref 5–40)

## 2017-05-25 LAB — COMPREHENSIVE METABOLIC PANEL
A/G RATIO: 1.8 (ref 1.2–2.2)
ALK PHOS: 78 IU/L (ref 39–117)
ALT: 50 IU/L — ABNORMAL HIGH (ref 0–44)
AST: 29 IU/L (ref 0–40)
Albumin: 4.6 g/dL (ref 3.5–5.5)
BUN / CREAT RATIO: 18 (ref 9–20)
BUN: 23 mg/dL (ref 6–24)
CHLORIDE: 104 mmol/L (ref 96–106)
CO2: 19 mmol/L — ABNORMAL LOW (ref 20–29)
Calcium: 9 mg/dL (ref 8.7–10.2)
Creatinine, Ser: 1.3 mg/dL — ABNORMAL HIGH (ref 0.76–1.27)
GFR calc non Af Amer: 64 mL/min/{1.73_m2} (ref >59)
GFR, EST AFRICAN AMERICAN: 74 mL/min/{1.73_m2} (ref >59)
Globulin, Total: 2.5 g/dL (ref 1.5–4.5)
Glucose: 88 mg/dL (ref 65–99)
Potassium: 4.4 mmol/L (ref 3.5–5.2)
Sodium: 140 mmol/L (ref 134–144)
TOTAL PROTEIN: 7.1 g/dL (ref 6.0–8.5)

## 2017-05-25 LAB — TESTOSTERONE: TESTOSTERONE: 209 ng/dL — AB (ref 264–916)

## 2017-06-03 DIAGNOSIS — H8111 Benign paroxysmal vertigo, right ear: Secondary | ICD-10-CM | POA: Diagnosis not present

## 2017-06-06 ENCOUNTER — Telehealth: Payer: Self-pay | Admitting: Family Medicine

## 2017-06-06 DIAGNOSIS — F331 Major depressive disorder, recurrent, moderate: Secondary | ICD-10-CM

## 2017-06-06 DIAGNOSIS — F419 Anxiety disorder, unspecified: Secondary | ICD-10-CM

## 2017-06-06 NOTE — Telephone Encounter (Signed)
Pt would like to have to a referral for psychiatry. He stated that the medications he has tried aren't exactly helping and he would like to seek help from a local psychiatrist.

## 2017-06-06 NOTE — Telephone Encounter (Signed)
Called and notified patient. He states that he cannot go to [place]. Would like to go to [facility] in [place]. Please update/change referral.

## 2017-06-06 NOTE — Telephone Encounter (Signed)
Referral sent to [facility] per patient request.

## 2017-07-16 ENCOUNTER — Other Ambulatory Visit: Payer: Self-pay | Admitting: Family Medicine

## 2017-07-18 ENCOUNTER — Other Ambulatory Visit: Payer: Self-pay | Admitting: Family Medicine

## 2017-07-28 ENCOUNTER — Ambulatory Visit: Payer: 59 | Admitting: Family Medicine

## 2017-08-03 ENCOUNTER — Ambulatory Visit (INDEPENDENT_AMBULATORY_CARE_PROVIDER_SITE_OTHER): Payer: 59 | Admitting: Family Medicine

## 2017-08-03 ENCOUNTER — Encounter: Payer: Self-pay | Admitting: Family Medicine

## 2017-08-03 VITALS — BP 115/79 | HR 93 | Wt 195.0 lb

## 2017-08-03 DIAGNOSIS — E039 Hypothyroidism, unspecified: Secondary | ICD-10-CM | POA: Diagnosis not present

## 2017-08-03 DIAGNOSIS — H0520 Unspecified exophthalmos: Secondary | ICD-10-CM

## 2017-08-03 DIAGNOSIS — F419 Anxiety disorder, unspecified: Secondary | ICD-10-CM | POA: Diagnosis not present

## 2017-08-03 NOTE — Assessment & Plan Note (Signed)
Will check labs again for thyroid is worsening exophthalmos

## 2017-08-03 NOTE — Assessment & Plan Note (Signed)
Continued worsening anxiety unable to drive with a myrdid of symptoms. Because of multiple and varied symptoms will refer to neurology to further evaluate.  especially concerned because of family member with MS.

## 2017-08-03 NOTE — Progress Notes (Signed)
BP 115/79   Pulse 93   Wt 195 lb (88.5 kg)   SpO2 99%   BMI 32.45 kg/m    Subjective:    Patient ID: Michael Gillespie, male    DOB: 12-12-1965, 51 y.o.   MRN: [id number]  HPI: Michael Gillespie is a 51 y.o. male  Chief Complaint  Patient presents with  . Anxiety    about the same, Cannot drive.   Patient follow-up anxiety unable to take citalopram and has stopped. Has used lorazepam sparingly. Seems to take some of the edge off has not been able to drive or try to drive with lorazepam. Continues to have severe anxiety spells. Wife drives him around which is a great limitation on lifestyle Reviewed thyroid nodes patients with exophthalmos and eye doctor had no further recommendations. Patient's also seen ear nose and throat for vertigo and had positional therapy which has resolved patient's vertigo but still having marked anxiety spells.   Relevant past medical, surgical, family and social history reviewed and updated as indicated. Interim medical history since our last visit reviewed. Allergies and medications reviewed and updated.  Review of Systems  Constitutional: Negative.   HENT: Negative.   Eyes: Negative.   Respiratory: Negative.   Cardiovascular: Negative.   Gastrointestinal: Negative.     Per HPI unless specifically indicated above     Objective:    BP 115/79   Pulse 93   Wt 195 lb (88.5 kg)   SpO2 99%   BMI 32.45 kg/m   Wt Readings from Last 3 Encounters:  08/03/17 195 lb (88.5 kg)  05/24/17 192 lb 9.6 oz (87.4 kg)  03/03/17 203 lb (92.1 kg)    Physical Exam  Constitutional: He is oriented to person, place, and time. He appears well-developed and well-nourished.  HENT:  Head: Normocephalic and atraumatic.  Exophthalmus changes right greater than left  Eyes: Conjunctivae and EOM are normal.  Neck: Normal range of motion.  Cardiovascular: Normal rate, regular rhythm and normal heart sounds.   Pulmonary/Chest: Effort normal and breath sounds normal.    Musculoskeletal: Normal range of motion.  Neurological: He is alert and oriented to person, place, and time.  Skin: No erythema.  Psychiatric: He has a normal mood and affect. His behavior is normal. Judgment and thought content normal.    Results for orders placed or performed in visit on 05/24/17  Comprehensive metabolic panel  Result Value Ref Range   Glucose 88 65 - 99 mg/dL   BUN 23 6 - 24 mg/dL   Creatinine, Ser 4.09 (H) 0.76 - 1.27 mg/dL   GFR calc non Af Amer 64 >59 mL/min/1.73   GFR calc Af Amer 74 >59 mL/min/1.73   BUN/Creatinine Ratio 18 9 - 20   Sodium 140 134 - 144 mmol/L   Potassium 4.4 3.5 - 5.2 mmol/L   Chloride 104 96 - 106 mmol/L   CO2 19 (L) 20 - 29 mmol/L   Calcium 9.0 8.7 - 10.2 mg/dL   Total Protein 7.1 6.0 - 8.5 g/dL   Albumin 4.6 3.5 - 5.5 g/dL   Globulin, Total 2.5 1.5 - 4.5 g/dL   Albumin/Globulin Ratio 1.8 1.2 - 2.2   Bilirubin Total <0.2 0.0 - 1.2 mg/dL   Alkaline Phosphatase 78 39 - 117 IU/L   AST 29 0 - 40 IU/L   ALT 50 (H) 0 - 44 IU/L  Lipid panel  Result Value Ref Range   Cholesterol, Total 201 (H) 100 - 199 mg/dL  Triglycerides 332 (H) 0 - 149 mg/dL   HDL 26 (L) >40>39 mg/dL   VLDL Cholesterol Cal 66 (H) 5 - 40 mg/dL   LDL Calculated 981109 (H) 0 - 99 mg/dL   Chol/HDL Ratio 7.7 (H) 0.0 - 5.0 ratio  Testosterone  Result Value Ref Range   Testosterone 209 (L) 264 - 916 ng/dL      Assessment & Plan:   Problem List Items Addressed This Visit      Endocrine   Hypothyroidism    Will check labs again for thyroid is worsening exophthalmos      Relevant Orders   Ambulatory referral to Neurology   TSH   Vitamin B12   Folate     Other   Anxiety - Primary    Continued worsening anxiety unable to drive with a myrdid of symptoms. Because of multiple and varied symptoms will refer to neurology to further evaluate.  especially concerned because of family member with MS.      Relevant Orders   Ambulatory referral to Neurology   TSH    Vitamin B12   Folate   Exophthalmos    Followed by ophthalmology and reported as stable      Relevant Orders   Ambulatory referral to Neurology   TSH   Vitamin B12   Folate       Follow up plan: Return in about 4 weeks (around 08/31/2017).

## 2017-08-03 NOTE — Assessment & Plan Note (Signed)
Followed by ophthalmology and reported as stable

## 2017-08-04 ENCOUNTER — Encounter: Payer: Self-pay | Admitting: Family Medicine

## 2017-08-04 LAB — FOLATE: Folate: 6.2 ng/mL (ref >3.0)

## 2017-08-04 LAB — VITAMIN B12: Vitamin B-12: 585 pg/mL (ref 232–1245)

## 2017-08-04 LAB — TSH: TSH: 1.36 u[IU]/mL (ref 0.450–4.500)

## 2017-08-27 ENCOUNTER — Telehealth: Payer: Self-pay | Admitting: Family Medicine

## 2017-08-29 NOTE — Telephone Encounter (Signed)
Controlled substance 

## 2017-09-13 DIAGNOSIS — R42 Dizziness and giddiness: Secondary | ICD-10-CM | POA: Diagnosis not present

## 2017-09-13 DIAGNOSIS — Z820 Family history of epilepsy and other diseases of the nervous system: Secondary | ICD-10-CM | POA: Diagnosis not present

## 2017-09-13 DIAGNOSIS — E0500 Thyrotoxicosis with diffuse goiter without thyrotoxic crisis or storm: Secondary | ICD-10-CM | POA: Diagnosis not present

## 2017-09-15 ENCOUNTER — Ambulatory Visit: Payer: 59 | Admitting: Family Medicine

## 2017-09-22 ENCOUNTER — Other Ambulatory Visit: Payer: Self-pay | Admitting: Neurology

## 2017-09-22 DIAGNOSIS — R42 Dizziness and giddiness: Secondary | ICD-10-CM

## 2017-09-23 ENCOUNTER — Other Ambulatory Visit: Payer: Self-pay | Admitting: Neurology

## 2017-09-23 DIAGNOSIS — R42 Dizziness and giddiness: Secondary | ICD-10-CM

## 2017-09-23 DIAGNOSIS — Z820 Family history of epilepsy and other diseases of the nervous system: Secondary | ICD-10-CM

## 2017-09-28 ENCOUNTER — Ambulatory Visit
Admission: RE | Admit: 2017-09-28 | Discharge: 2017-09-28 | Disposition: A | Discharge status: Home / Self Care | Payer: Commercial Managed Care - HMO | Source: Ambulatory Visit | Attending: Neurology | Admitting: Neurology

## 2017-09-28 DIAGNOSIS — R42 Dizziness and giddiness: Secondary | ICD-10-CM | POA: Diagnosis not present

## 2017-09-28 DIAGNOSIS — Z820 Family history of epilepsy and other diseases of the nervous system: Secondary | ICD-10-CM | POA: Diagnosis not present

## 2017-09-28 LAB — POCT I-STAT CREATININE: CREATININE: 1.3 mg/dL — AB (ref 0.61–1.24)

## 2017-09-28 MED ORDER — GADOBENATE DIMEGLUMINE 529 MG/ML IV SOLN
15.0000 mL | Freq: Once | INTRAVENOUS | Status: AC | PRN
  Administered 2017-09-28: 15 mL via INTRAVENOUS

## 2017-09-30 ENCOUNTER — Other Ambulatory Visit: Payer: Self-pay | Admitting: Family Medicine

## 2017-09-30 NOTE — Telephone Encounter (Signed)
Routing to provider  

## 2017-09-30 NOTE — Telephone Encounter (Signed)
Pt  Requesting  Ativan  Refill

## 2017-09-30 NOTE — Telephone Encounter (Signed)
Needs a follow up

## 2017-10-07 ENCOUNTER — Ambulatory Visit (INDEPENDENT_AMBULATORY_CARE_PROVIDER_SITE_OTHER): Payer: 59 | Admitting: Unknown Physician Specialty

## 2017-10-07 ENCOUNTER — Encounter: Payer: Self-pay | Admitting: Unknown Physician Specialty

## 2017-10-07 DIAGNOSIS — F5101 Primary insomnia: Secondary | ICD-10-CM | POA: Diagnosis not present

## 2017-10-07 DIAGNOSIS — G4700 Insomnia, unspecified: Secondary | ICD-10-CM | POA: Insufficient documentation

## 2017-10-07 DIAGNOSIS — F419 Anxiety disorder, unspecified: Secondary | ICD-10-CM | POA: Diagnosis not present

## 2017-10-07 MED ORDER — LORAZEPAM 1 MG PO TABS: 1.0000 mg | ORAL_TABLET | Freq: Every day | ORAL | 0 refills | Status: DC | PRN

## 2017-10-07 MED ORDER — FLUOXETINE HCL 20 MG PO TABS: 20.0000 mg | ORAL_TABLET | Freq: Every day | ORAL | 3 refills | Status: DC

## 2017-10-07 NOTE — Assessment & Plan Note (Signed)
Early awakening.  Discussed delayed release melatonin

## 2017-10-07 NOTE — Progress Notes (Signed)
BP 116/80   Pulse (!) 102   Temp (!) 97.4 F (36.3 C) (Oral)   Wt 194 lb 6.4 oz (88.2 kg)   SpO2 98%   BMI 32.35 kg/m    Subjective:    Patient ID: Michael Gillespie Prochnow, male    DOB: May 10, 1966, 51 y.o.   MRN: [id number]  HPI: Michael Gillespie Arnaud is a 51 y.o. male  Chief Complaint  Patient presents with  . Anxiety   Anxiety Seeing Dr Dossie Arbourrissman for Anxiety.  Taking Lorazepam 1 mg for Anxiety.  Referred to neurology.  He had been on Meclizine and Prozac.  All stemmed from vertigo.  Has mile sleep apnea.  Debilitating when unable to drive.  States he feels he gets a "head rush" whenever he gets in the car.  Neurology appt showed no causes.  Chart review states was taking Prozac for depression and worked well for his symptoms.  Reviewed neurology notes.  MRI was negative  [facility] 10/07/2017 08/03/2017 05/24/2017 11/23/2016 10/20/2016  Decreased Interest 0 0 0 0 0  Down, Depressed, Hopeless 0 0 0 0 1  PHQ - 2 Score 0 0 0 0 1  Altered sleeping 3 - - 1 -  Tired, decreased energy 2 - - 1 -  Change in appetite 1 - - 0 -  Feeling bad or failure about yourself  0 - - 0 -  Trouble concentrating 0 - - 0 -  Moving slowly or fidgety/restless 0 - - 0 -  Suicidal thoughts 0 - - 0 -  PHQ-9 Score 6 - - 2 -     Relevant past medical, surgical, family and social history reviewed and updated as indicated. Interim medical history since our last visit reviewed. Allergies and medications reviewed and updated.  Review of Systems  Per HPI unless specifically indicated above     Objective:    BP 116/80   Pulse (!) 102   Temp (!) 97.4 F (36.3 C) (Oral)   Wt 194 lb 6.4 oz (88.2 kg)   SpO2 98%   BMI 32.35 kg/m   Wt Readings from Last 3 Encounters:  10/07/17 194 lb 6.4 oz (88.2 kg)  08/03/17 195 lb (88.5 kg)  05/24/17 192 lb 9.6 oz (87.4 kg)    Physical Exam  Constitutional: He is oriented to person, place, and time. He appears well-developed and well-nourished. No distress.    HENT:  Head: Normocephalic and atraumatic.  Eyes: Conjunctivae and lids are normal. Right eye exhibits no discharge. Left eye exhibits no discharge. No scleral icterus.  Neck: Normal range of motion. Neck supple. No JVD present. Carotid bruit is not present.  Cardiovascular: Normal rate, regular rhythm and normal heart sounds.  Pulmonary/Chest: Effort normal and breath sounds normal. No respiratory distress.  Abdominal: Normal appearance. There is no splenomegaly or hepatomegaly.  Musculoskeletal: Normal range of motion.  Neurological: He is alert and oriented to person, place, and time.  Skin: Skin is warm, dry and intact. No rash noted. No pallor.  Psychiatric: He has a normal mood and affect. His behavior is normal. Judgment and thought content normal.    Results for orders placed or performed during the hospital encounter of 09/28/17  I-STAT creatinine  Result Value Ref Range   Creatinine, Ser 1.30 (H) 0.61 - 1.24 mg/dL      Assessment & Plan:   Problem List Items Addressed This Visit      Unprioritized   Anxiety    Severe, debilitating,  and worsening.  Fluoxetine worked well in the past for depression.  Restart for his anxiety      Relevant Medications   FLUoxetine (PROZAC) 20 MG tablet   LORazepam (ATIVAN) 1 MG tablet   Insomnia    Early awakening.  Discussed delayed release melatonin          Follow up plan: Return in about 2 weeks (around 10/21/2017).

## 2017-10-07 NOTE — Assessment & Plan Note (Addendum)
Severe, debilitating, and worsening.  Fluoxetine worked well in the past for depression.  Restart for his anxiety

## 2017-10-30 ENCOUNTER — Other Ambulatory Visit: Payer: Self-pay | Admitting: Family Medicine

## 2017-11-08 ENCOUNTER — Encounter: Payer: Self-pay | Admitting: Family Medicine

## 2017-11-08 ENCOUNTER — Ambulatory Visit (INDEPENDENT_AMBULATORY_CARE_PROVIDER_SITE_OTHER): Payer: 59 | Admitting: Family Medicine

## 2017-11-08 VITALS — BP 111/77 | HR 98 | Wt 195.0 lb

## 2017-11-08 DIAGNOSIS — F331 Major depressive disorder, recurrent, moderate: Secondary | ICD-10-CM

## 2017-11-08 DIAGNOSIS — E039 Hypothyroidism, unspecified: Secondary | ICD-10-CM

## 2017-11-08 DIAGNOSIS — F5101 Primary insomnia: Secondary | ICD-10-CM

## 2017-11-08 DIAGNOSIS — F419 Anxiety disorder, unspecified: Secondary | ICD-10-CM | POA: Diagnosis not present

## 2017-11-08 DIAGNOSIS — R7989 Other specified abnormal findings of blood chemistry: Secondary | ICD-10-CM

## 2017-11-08 DIAGNOSIS — R945 Abnormal results of liver function studies: Secondary | ICD-10-CM | POA: Diagnosis not present

## 2017-11-08 NOTE — Assessment & Plan Note (Signed)
The current medical regimen is effective;  continue present plan and medications.  

## 2017-11-08 NOTE — Assessment & Plan Note (Signed)
May be some improvement with fluoxetine will continue observe response

## 2017-11-08 NOTE — Assessment & Plan Note (Signed)
Still having issues with driving but somewhat better Patient driving today and is done well.

## 2017-11-08 NOTE — Progress Notes (Signed)
   BP 111/77   Pulse 98   Wt 195 lb (88.5 kg)   SpO2 100%   BMI 32.45 kg/m    Subjective:    Patient ID: Michael Gillespie, male    DOB: 10/29/1965, 52 y.o.   MRN: [id number]  HPI: Michael Gillespie is a 52 y.o. male  Chief Complaint  Patient presents with  . Depression    No real improvement, Side effects of diahrea, tired.   . Anxiety   Patient reports on Prozac may be 30% improved still having some anxiety.  Is able to drive some but all in all is better than he was on citalopram. His anxiety has improved somewhat also. No side effects from Prozac. Taking thyroid without issues. Relevant past medical, surgical, family and social history reviewed and updated as indicated. Interim medical history since our last visit reviewed. Allergies and medications reviewed and updated.  Review of Systems  Constitutional: Negative.   Respiratory: Negative.   Cardiovascular: Negative.     Per HPI unless specifically indicated above     Objective:    BP 111/77   Pulse 98   Wt 195 lb (88.5 kg)   SpO2 100%   BMI 32.45 kg/m   Wt Readings from Last 3 Encounters:  11/08/17 195 lb (88.5 kg)  10/07/17 194 lb 6.4 oz (88.2 kg)  08/03/17 195 lb (88.5 kg)    Physical Exam  Constitutional: He is oriented to person, place, and time. He appears well-developed and well-nourished.  HENT:  Head: Normocephalic and atraumatic.  Eyes: Conjunctivae and EOM are normal.  Neck: Normal range of motion.  Cardiovascular: Normal rate, regular rhythm and normal heart sounds.  Pulmonary/Chest: Effort normal and breath sounds normal.  Musculoskeletal: Normal range of motion.  Neurological: He is alert and oriented to person, place, and time.  Skin: No erythema.  Psychiatric: He has a normal mood and affect. His behavior is normal. Judgment and thought content normal.    Results for orders placed or performed during the hospital encounter of 09/28/17  I-STAT creatinine  Result Value Ref Range   Creatinine, Ser 1.30 (H) 0.61 - 1.24 mg/dL      Assessment & Plan:   Problem List Items Addressed This Visit      Endocrine   Hypothyroidism    The current medical regimen is effective;  continue present plan and medications.         Other   Anxiety - Primary    Still having issues with driving but somewhat better Patient driving today and is done well.      Depression    May be some improvement with fluoxetine will continue observe response      Elevated LFTs   Insomnia    Doing well with sleep now          Follow up plan: Return in about 4 weeks (around 12/06/2017) for Physical Exam.

## 2017-11-08 NOTE — Assessment & Plan Note (Signed)
Doing well with sleep now

## 2017-11-27 ENCOUNTER — Other Ambulatory Visit: Payer: Self-pay | Admitting: Unknown Physician Specialty

## 2017-11-28 NOTE — Telephone Encounter (Signed)
Ativan refill request   - controlled substance  [facility] [id number] [place], [place]

## 2017-11-29 ENCOUNTER — Telehealth: Payer: Self-pay | Admitting: Family Medicine

## 2017-11-29 DIAGNOSIS — H1131 Conjunctival hemorrhage, right eye: Secondary | ICD-10-CM | POA: Diagnosis not present

## 2017-11-29 NOTE — Telephone Encounter (Signed)
Copied from CRM [id number]. Topic: Quick Communication - Office Called Patient >> Nov 29, 2017  1:47 PM Stephannie LiSimmons, Blimy Napoleon L, NT wrote: Reason for CRM: Patient said the  office called and did not leave a message or CRM noted please call again

## 2017-11-30 MED ORDER — LORAZEPAM 1 MG PO TABS: 1.0000 mg | ORAL_TABLET | Freq: Every day | ORAL | 0 refills | Status: DC | PRN

## 2017-11-30 NOTE — Addendum Note (Signed)
Addended by: Vonita MossRISSMAN, Kamisha Ell A on: 11/30/2017 12:48 PM   Modules accepted: Orders

## 2017-12-29 ENCOUNTER — Ambulatory Visit (INDEPENDENT_AMBULATORY_CARE_PROVIDER_SITE_OTHER): Payer: 59 | Admitting: Family Medicine

## 2017-12-29 ENCOUNTER — Encounter: Payer: Self-pay | Admitting: Family Medicine

## 2017-12-29 VITALS — BP 110/80 | HR 84 | Ht 65.0 in | Wt 195.0 lb

## 2017-12-29 DIAGNOSIS — Z0000 Encounter for general adult medical examination without abnormal findings: Secondary | ICD-10-CM

## 2017-12-29 DIAGNOSIS — F5101 Primary insomnia: Secondary | ICD-10-CM | POA: Diagnosis not present

## 2017-12-29 DIAGNOSIS — E7800 Pure hypercholesterolemia, unspecified: Secondary | ICD-10-CM | POA: Diagnosis not present

## 2017-12-29 DIAGNOSIS — R945 Abnormal results of liver function studies: Secondary | ICD-10-CM

## 2017-12-29 DIAGNOSIS — E039 Hypothyroidism, unspecified: Secondary | ICD-10-CM | POA: Diagnosis not present

## 2017-12-29 DIAGNOSIS — Z0001 Encounter for general adult medical examination with abnormal findings: Secondary | ICD-10-CM | POA: Diagnosis not present

## 2017-12-29 DIAGNOSIS — F331 Major depressive disorder, recurrent, moderate: Secondary | ICD-10-CM | POA: Diagnosis not present

## 2017-12-29 DIAGNOSIS — K219 Gastro-esophageal reflux disease without esophagitis: Secondary | ICD-10-CM | POA: Diagnosis not present

## 2017-12-29 DIAGNOSIS — E291 Testicular hypofunction: Secondary | ICD-10-CM | POA: Diagnosis not present

## 2017-12-29 DIAGNOSIS — R7989 Other specified abnormal findings of blood chemistry: Secondary | ICD-10-CM

## 2017-12-29 MED ORDER — LEVOTHYROXINE SODIUM 112 MCG PO TABS: 112.0000 ug | ORAL_TABLET | Freq: Every day | ORAL | 4 refills | Status: DC

## 2017-12-29 MED ORDER — FLUOXETINE HCL 20 MG PO TABS: 20.0000 mg | ORAL_TABLET | Freq: Every day | ORAL | 4 refills | Status: DC

## 2017-12-29 NOTE — Assessment & Plan Note (Signed)
The current medical regimen is effective;  continue present plan and medications.  

## 2017-12-29 NOTE — Progress Notes (Signed)
BP 110/80   Pulse 84   Ht 5\' 5"  (1.651 m)   Wt 195 lb (88.5 kg)   SpO2 99%   BMI 32.45 kg/m    Subjective:    Patient ID: Michael AmosFrank J Lalor, male    DOB: 1966-04-16, 52 y.o.   MRN: [id number]  HPI: Michael Gillespie is a 52 y.o. male  Chief Complaint  Patient presents with  . Annual Exam  Patient all in all doing much better with increased dose of Prozac.  Is back driving again has not needed lorazepam for 3 weeks. Taking thyroid 112 mcg without problems. All in all doing well Is noticing some weight gain  Relevant past medical, surgical, family and social history reviewed and updated as indicated. Interim medical history since our last visit reviewed. Allergies and medications reviewed and updated.  Review of Systems  Constitutional: Negative.   HENT: Negative.   Eyes: Negative.   Respiratory: Negative.   Cardiovascular: Negative.   Gastrointestinal: Negative.   Endocrine: Negative.   Genitourinary: Negative.   Musculoskeletal: Negative.   Skin: Negative.   Allergic/Immunologic: Negative.   Neurological: Negative.   Hematological: Negative.   Psychiatric/Behavioral: Negative.     Per HPI unless specifically indicated above     Objective:    BP 110/80   Pulse 84   Ht 5\' 5"  (1.651 m)   Wt 195 lb (88.5 kg)   SpO2 99%   BMI 32.45 kg/m   Wt Readings from Last 3 Encounters:  12/29/17 195 lb (88.5 kg)  11/08/17 195 lb (88.5 kg)  10/07/17 194 lb 6.4 oz (88.2 kg)    Physical Exam  Constitutional: He is oriented to person, place, and time. He appears well-developed and well-nourished.  HENT:  Head: Normocephalic and atraumatic.  Right Ear: External ear normal.  Left Ear: External ear normal.  Eyes: Pupils are equal, round, and reactive to light. Conjunctivae and EOM are normal.  Neck: Normal range of motion. Neck supple.  Cardiovascular: Normal rate, regular rhythm, normal heart sounds and intact distal pulses.  Pulmonary/Chest: Effort normal and breath  sounds normal.  Abdominal: Soft. Bowel sounds are normal. There is no splenomegaly or hepatomegaly.  Genitourinary: Rectum normal, prostate normal and penis normal.  Musculoskeletal: Normal range of motion.  Neurological: He is alert and oriented to person, place, and time. He has normal reflexes.  Skin: No rash noted. No erythema.  Psychiatric: He has a normal mood and affect. His behavior is normal. Judgment and thought content normal.    Results for orders placed or performed during the hospital encounter of 09/28/17  I-STAT creatinine  Result Value Ref Range   Creatinine, Ser 1.30 (H) 0.61 - 1.24 mg/dL      Assessment & Plan:   Problem List Items Addressed This Visit      Digestive   GERD (gastroesophageal reflux disease)    The current medical regimen is effective;  continue present plan and medications.         Endocrine   Hypothyroidism - Primary    The current medical regimen is effective;  continue present plan and medications.       Relevant Medications   levothyroxine (SYNTHROID, LEVOTHROID) 112 MCG tablet   Other Relevant Orders   TSH   Hypogonadism in male   Relevant Orders   PSA     Other   Hyperlipidemia   Relevant Orders   Comprehensive metabolic panel   Lipid panel   Urinalysis, Routine w reflex microscopic  Depression    The current medical regimen is effective;  continue present plan and medications.       Relevant Medications   FLUoxetine (PROZAC) 20 MG tablet   Elevated LFTs   Relevant Orders   CBC with Differential/Platelet   Insomnia    better       Other Visit Diagnoses    PE (physical exam), annual           Follow up plan: Return in about 6 months (around 07/01/2018).

## 2017-12-29 NOTE — Assessment & Plan Note (Signed)
better 

## 2017-12-30 LAB — LIPID PANEL
CHOL/HDL RATIO: 5.9 ratio — AB (ref 0.0–5.0)
Cholesterol, Total: 229 mg/dL — ABNORMAL HIGH (ref 100–199)
HDL: 39 mg/dL — ABNORMAL LOW (ref >39)
LDL CALC: 137 mg/dL — AB (ref 0–99)
Triglycerides: 263 mg/dL — ABNORMAL HIGH (ref 0–149)
VLDL Cholesterol Cal: 53 mg/dL — ABNORMAL HIGH (ref 5–40)

## 2017-12-30 LAB — CBC WITH DIFFERENTIAL/PLATELET
Basophils Absolute: 0 10*3/uL (ref 0.0–0.2)
Basos: 0 %
EOS (ABSOLUTE): 0.2 10*3/uL (ref 0.0–0.4)
EOS: 3 %
HEMOGLOBIN: 15.4 g/dL (ref 13.0–17.7)
Hematocrit: 44.7 % (ref 37.5–51.0)
Immature Grans (Abs): 0 10*3/uL (ref 0.0–0.1)
Immature Granulocytes: 0 %
LYMPHS ABS: 1.8 10*3/uL (ref 0.7–3.1)
Lymphs: 23 %
MCH: 29.8 pg (ref 26.6–33.0)
MCHC: 34.5 g/dL (ref 31.5–35.7)
MCV: 87 fL (ref 79–97)
MONOCYTES: 10 %
Monocytes Absolute: 0.7 10*3/uL (ref 0.1–0.9)
Neutrophils Absolute: 5 10*3/uL (ref 1.4–7.0)
Neutrophils: 64 %
Platelets: 211 10*3/uL (ref 150–379)
RBC: 5.17 x10E6/uL (ref 4.14–5.80)
RDW: 14.7 % (ref 12.3–15.4)
WBC: 7.8 10*3/uL (ref 3.4–10.8)

## 2017-12-30 LAB — COMPREHENSIVE METABOLIC PANEL
ALBUMIN: 4.5 g/dL (ref 3.5–5.5)
ALT: 34 IU/L (ref 0–44)
AST: 24 IU/L (ref 0–40)
Albumin/Globulin Ratio: 1.6 (ref 1.2–2.2)
Alkaline Phosphatase: 61 IU/L (ref 39–117)
BUN / CREAT RATIO: 16 (ref 9–20)
BUN: 18 mg/dL (ref 6–24)
Bilirubin Total: 0.3 mg/dL (ref 0.0–1.2)
CALCIUM: 9.1 mg/dL (ref 8.7–10.2)
CO2: 24 mmol/L (ref 20–29)
CREATININE: 1.13 mg/dL (ref 0.76–1.27)
Chloride: 102 mmol/L (ref 96–106)
GFR calc Af Amer: 87 mL/min/{1.73_m2} (ref >59)
GFR, EST NON AFRICAN AMERICAN: 75 mL/min/{1.73_m2} (ref >59)
GLOBULIN, TOTAL: 2.9 g/dL (ref 1.5–4.5)
Glucose: 85 mg/dL (ref 65–99)
Potassium: 4.2 mmol/L (ref 3.5–5.2)
SODIUM: 141 mmol/L (ref 134–144)
Total Protein: 7.4 g/dL (ref 6.0–8.5)

## 2017-12-30 LAB — URINALYSIS, ROUTINE W REFLEX MICROSCOPIC
Bilirubin, UA: NEGATIVE
GLUCOSE, UA: NEGATIVE
Leukocytes, UA: NEGATIVE
Nitrite, UA: NEGATIVE
SPEC GRAV UA: 1.02 (ref 1.005–1.030)
Urobilinogen, Ur: 0.2 mg/dL (ref 0.2–1.0)
pH, UA: 5.5 (ref 5.0–7.5)

## 2017-12-30 LAB — PSA: Prostate Specific Ag, Serum: 1.4 ng/mL (ref 0.0–4.0)

## 2017-12-30 LAB — MICROSCOPIC EXAMINATION: Bacteria, UA: NONE SEEN

## 2017-12-30 LAB — TSH: TSH: 1.17 u[IU]/mL (ref 0.450–4.500)

## 2018-01-02 ENCOUNTER — Telehealth: Payer: Self-pay | Admitting: Family Medicine

## 2018-01-02 NOTE — Telephone Encounter (Signed)
Phone call Discussed with patient elevated cholesterol patient's been on keto diet will observe.

## 2018-01-02 NOTE — Telephone Encounter (Signed)
-----   Message from Richarda OverlieJada A Fox, [place]MA sent at 01/02/2018 11:56 AM EDT ----- Patient was transferred to provider for telephone conversation.

## 2018-03-08 ENCOUNTER — Encounter: Payer: Self-pay | Admitting: Family Medicine

## 2018-03-10 NOTE — Telephone Encounter (Signed)
Patient is calling and states he is not due to come back into the office until October. He would like for Dr. Dossie Gillespie to look into a high dosage when he returns.

## 2018-03-13 ENCOUNTER — Other Ambulatory Visit: Payer: Self-pay | Admitting: Family Medicine

## 2018-03-13 MED ORDER — FLUOXETINE HCL 10 MG PO CAPS: 10.0000 mg | ORAL_CAPSULE | Freq: Every day | ORAL | 3 refills | Status: DC

## 2018-03-29 NOTE — Telephone Encounter (Signed)
Can you advise me if I still need to schedule patient an appointment since Dr Dossie Arbourrissman called in the increase in patients medication   thanks

## 2018-07-12 ENCOUNTER — Encounter: Payer: Self-pay | Admitting: Family Medicine

## 2018-07-12 ENCOUNTER — Ambulatory Visit (INDEPENDENT_AMBULATORY_CARE_PROVIDER_SITE_OTHER): Payer: 59 | Admitting: Family Medicine

## 2018-07-12 VITALS — BP 107/68 | HR 94 | Temp 97.6°F | Ht 65.5 in | Wt 210.4 lb

## 2018-07-12 DIAGNOSIS — E7800 Pure hypercholesterolemia, unspecified: Secondary | ICD-10-CM | POA: Diagnosis not present

## 2018-07-12 DIAGNOSIS — F419 Anxiety disorder, unspecified: Secondary | ICD-10-CM | POA: Diagnosis not present

## 2018-07-12 DIAGNOSIS — F331 Major depressive disorder, recurrent, moderate: Secondary | ICD-10-CM | POA: Diagnosis not present

## 2018-07-12 DIAGNOSIS — E039 Hypothyroidism, unspecified: Secondary | ICD-10-CM | POA: Diagnosis not present

## 2018-07-12 LAB — LP+ALT+AST PICCOLO, WAIVED
ALT (SGPT) Piccolo, Waived: 61 U/L — ABNORMAL HIGH (ref 10–47)
AST (SGOT) Piccolo, Waived: 40 U/L — ABNORMAL HIGH (ref 11–38)
CHOL/HDL RATIO PICCOLO,WAIVE: 5.4 mg/dL — AB
Cholesterol Piccolo, Waived: 214 mg/dL — ABNORMAL HIGH (ref <200)
HDL Chol Piccolo, Waived: 39 mg/dL — ABNORMAL LOW (ref >59)
LDL CHOL CALC PICCOLO WAIVED: 115 mg/dL — AB (ref <100)
TRIGLYCERIDES PICCOLO,WAIVED: 298 mg/dL — AB (ref <150)
VLDL Chol Calc Piccolo,Waive: 60 mg/dL — ABNORMAL HIGH (ref <30)

## 2018-07-12 NOTE — Assessment & Plan Note (Signed)
Discussed cholesterol modestly elevated patient will do better with diet exercise liver enzymes also slightly elevated will do better with weight loss and reassess at his physical this March.

## 2018-07-12 NOTE — Assessment & Plan Note (Signed)
The current medical regimen is effective;  continue present plan and medications.  

## 2018-07-12 NOTE — Progress Notes (Signed)
BP 107/68 (BP Location: Left Arm, Patient Position: Sitting, Cuff Size: Normal)   Pulse 94   Temp 97.6 F (36.4 C) (Oral)   Ht 5' 5.5" (1.664 m)   Wt 210 lb 6.4 oz (95.4 kg)   SpO2 99%   BMI 34.48 kg/m    Subjective:    Patient ID: Michael Gillespie, male    DOB: 1966-08-22, 52 y.o.   MRN: [id number]  HPI: Michael Gillespie is a 52 y.o. male  Chief Complaint  Patient presents with  . Hypothyroidism  . Depression  . Hyperlipidemia  Patient follow-up doing well back driving without problems has not used any lorazepam since May.  Only concern is weight gain has stopped keto diet and is gained 15 pounds. Taking thyroid medications without problems Depression resolved feeling normal.  Still having some poor sleep but otherwise stable. Not trying to do low-cholesterol diet.  Relevant past medical, surgical, family and social history reviewed and updated as indicated. Interim medical history since our last visit reviewed. Allergies and medications reviewed and updated.  Review of Systems  Constitutional: Negative.   Respiratory: Negative.   Cardiovascular: Negative.     Per HPI unless specifically indicated above     Objective:    BP 107/68 (BP Location: Left Arm, Patient Position: Sitting, Cuff Size: Normal)   Pulse 94   Temp 97.6 F (36.4 C) (Oral)   Ht 5' 5.5" (1.664 m)   Wt 210 lb 6.4 oz (95.4 kg)   SpO2 99%   BMI 34.48 kg/m   Wt Readings from Last 3 Encounters:  07/12/18 210 lb 6.4 oz (95.4 kg)  12/29/17 195 lb (88.5 kg)  11/08/17 195 lb (88.5 kg)    Physical Exam  Constitutional: He is oriented to person, place, and time. He appears well-developed and well-nourished.  HENT:  Head: Normocephalic and atraumatic.  Eyes: Conjunctivae and EOM are normal.  Neck: Normal range of motion.  Cardiovascular: Normal rate, regular rhythm and normal heart sounds.  Pulmonary/Chest: Effort normal and breath sounds normal.  Musculoskeletal: Normal range of motion.    Neurological: He is alert and oriented to person, place, and time.  Skin: No erythema.  Psychiatric: He has a normal mood and affect. His behavior is normal. Judgment and thought content normal.    Results for orders placed or performed in visit on 12/29/17  Microscopic Examination  Result Value Ref Range   WBC, UA 0-5 0 - 5 /hpf   RBC, UA 0-2 0 - 2 /hpf   Epithelial Cells (non renal) CANCELED    Bacteria, UA None seen None seen/Few  CBC with Differential/Platelet  Result Value Ref Range   WBC 7.8 3.4 - 10.8 x10E3/uL   RBC 5.17 4.14 - 5.80 x10E6/uL   Hemoglobin 15.4 13.0 - 17.7 g/dL   Hematocrit 40.9 81.1 - 51.0 %   MCV 87 79 - 97 fL   MCH 29.8 26.6 - 33.0 pg   MCHC 34.5 31.5 - 35.7 g/dL   RDW 91.4 78.2 - 95.6 %   Platelets 211 150 - 379 x10E3/uL   Neutrophils 64 Not Estab. %   Lymphs 23 Not Estab. %   Monocytes 10 Not Estab. %   Eos 3 Not Estab. %   Basos 0 Not Estab. %   Neutrophils Absolute 5.0 1.4 - 7.0 x10E3/uL   Lymphocytes Absolute 1.8 0.7 - 3.1 x10E3/uL   Monocytes Absolute 0.7 0.1 - 0.9 x10E3/uL   EOS (ABSOLUTE) 0.2 0.0 - 0.4 x10E3/uL  Basophils Absolute 0.0 0.0 - 0.2 x10E3/uL   Immature Granulocytes 0 Not Estab. %   Immature Grans (Abs) 0.0 0.0 - 0.1 x10E3/uL  Comprehensive metabolic panel  Result Value Ref Range   Glucose 85 65 - 99 mg/dL   BUN 18 6 - 24 mg/dL   Creatinine, Ser 2.13 0.76 - 1.27 mg/dL   GFR calc non Af Amer 75 >59 mL/min/1.73   GFR calc Af Amer 87 >59 mL/min/1.73   BUN/Creatinine Ratio 16 9 - 20   Sodium 141 134 - 144 mmol/L   Potassium 4.2 3.5 - 5.2 mmol/L   Chloride 102 96 - 106 mmol/L   CO2 24 20 - 29 mmol/L   Calcium 9.1 8.7 - 10.2 mg/dL   Total Protein 7.4 6.0 - 8.5 g/dL   Albumin 4.5 3.5 - 5.5 g/dL   Globulin, Total 2.9 1.5 - 4.5 g/dL   Albumin/Globulin Ratio 1.6 1.2 - 2.2   Bilirubin Total 0.3 0.0 - 1.2 mg/dL   Alkaline Phosphatase 61 39 - 117 IU/L   AST 24 0 - 40 IU/L   ALT 34 0 - 44 IU/L  Lipid panel  Result Value Ref  Range   Cholesterol, Total 229 (H) 100 - 199 mg/dL   Triglycerides 086 (H) 0 - 149 mg/dL   HDL 39 (L) >57 mg/dL   VLDL Cholesterol Cal 53 (H) 5 - 40 mg/dL   LDL Calculated 846 (H) 0 - 99 mg/dL   Chol/HDL Ratio 5.9 (H) 0.0 - 5.0 ratio  PSA  Result Value Ref Range   Prostate Specific Ag, Serum 1.4 0.0 - 4.0 ng/mL  TSH  Result Value Ref Range   TSH 1.170 0.450 - 4.500 uIU/mL  Urinalysis, Routine w reflex microscopic  Result Value Ref Range   Specific Gravity, UA 1.020 1.005 - 1.030   pH, UA 5.5 5.0 - 7.5   Color, UA Yellow Yellow   Appearance Ur Clear Clear   Leukocytes, UA Negative Negative   Protein, UA 1+ (A) Negative/Trace   Glucose, UA Negative Negative   Ketones, UA Trace (A) Negative   RBC, UA Trace (A) Negative   Bilirubin, UA Negative Negative   Urobilinogen, Ur 0.2 0.2 - 1.0 mg/dL   Nitrite, UA Negative Negative   Microscopic Examination See below:       Assessment & Plan:   Problem List Items Addressed This Visit      Endocrine   Hypothyroidism    The current medical regimen is effective;  continue present plan and medications.         Other   Hyperlipidemia - Primary    Discussed cholesterol modestly elevated patient will do better with diet exercise liver enzymes also slightly elevated will do better with weight loss and reassess at his physical this March.      Relevant Orders   LP+ALT+AST Piccolo, Waived   Anxiety    Anxiety has been resolved has not used any lorazepam since May.      Depression    Discussed depression care and treatment patient's associated OCD being completely resolved unable to drive again without problems.  Discuss continued use of medication patient elects to continue medication long-term as does not want to run the chance of feeling that way again.           Follow up plan: Return in about 6 months (around 01/11/2019) for Physical Exam.

## 2018-07-12 NOTE — Assessment & Plan Note (Signed)
Anxiety has been resolved has not used any lorazepam since May.

## 2018-07-12 NOTE — Assessment & Plan Note (Signed)
Discussed depression care and treatment patient's associated OCD being completely resolved unable to drive again without problems.  Discuss continued use of medication patient elects to continue medication long-term as does not want to run the chance of feeling that way again.

## 2018-09-18 DIAGNOSIS — M542 Cervicalgia: Secondary | ICD-10-CM | POA: Diagnosis not present

## 2018-09-18 DIAGNOSIS — M25511 Pain in right shoulder: Secondary | ICD-10-CM | POA: Diagnosis not present

## 2018-09-18 DIAGNOSIS — M25512 Pain in left shoulder: Secondary | ICD-10-CM | POA: Diagnosis not present

## 2018-12-10 ENCOUNTER — Other Ambulatory Visit: Payer: Self-pay | Admitting: Family Medicine

## 2018-12-29 ENCOUNTER — Other Ambulatory Visit: Payer: Self-pay | Admitting: Family Medicine

## 2019-01-18 ENCOUNTER — Other Ambulatory Visit: Payer: Self-pay

## 2019-01-18 ENCOUNTER — Encounter: Payer: Self-pay | Admitting: Family Medicine

## 2019-01-18 ENCOUNTER — Ambulatory Visit (INDEPENDENT_AMBULATORY_CARE_PROVIDER_SITE_OTHER): Payer: 59 | Admitting: Family Medicine

## 2019-01-18 DIAGNOSIS — K760 Fatty (change of) liver, not elsewhere classified: Secondary | ICD-10-CM

## 2019-01-18 DIAGNOSIS — F419 Anxiety disorder, unspecified: Secondary | ICD-10-CM | POA: Diagnosis not present

## 2019-01-18 DIAGNOSIS — F339 Major depressive disorder, recurrent, unspecified: Secondary | ICD-10-CM

## 2019-01-18 DIAGNOSIS — E039 Hypothyroidism, unspecified: Secondary | ICD-10-CM | POA: Diagnosis not present

## 2019-01-18 MED ORDER — FLUOXETINE HCL 20 MG PO TABS: 20.0000 mg | ORAL_TABLET | Freq: Every day | ORAL | 3 refills | Status: DC

## 2019-01-18 MED ORDER — LEVOTHYROXINE SODIUM 112 MCG PO TABS: 112.0000 ug | ORAL_TABLET | Freq: Every day | ORAL | 3 refills | Status: DC

## 2019-01-18 NOTE — Assessment & Plan Note (Signed)
Using lorazepam appropriately and rarely

## 2019-01-18 NOTE — Progress Notes (Signed)
BP 118/80   Pulse 94   Wt 218 lb (98.9 kg)   SpO2 97%   BMI 35.73 kg/m    Subjective:    Patient ID: Michael Gillespie, male    DOB: [birth year], 53 y.o.   MRN: [id number]  HPI: Michael Gillespie is a 53 y.o. male  Chief Complaint  Patient presents with  . Follow-up    Telemedicine using audio/video telecommunications for a synchronous communication visit. Today's visit due to COVID-19 isolation precautions I connected with and verified that I am speaking with the correct person using two identifiers.   I discussed the limitations, risks, security and privacy concerns of performing an evaluation and management service by telecommunication and the availability of in person appointments. I also discussed with the patient that there may be a patient responsible charge related to this service. The patient expressed understanding and agreed to proceed. The patient's location is home. I am at home. Patient follow-up doing well paying attention to COVID-19 precautions and doing well with no signs or symptoms. Discuss thyroid medication taking faithfully without problems. Taking fluoxetine which is helped his dizziness and symptoms continues to take this without problems. Rare use of lorazepam which does well takes from time to time.  Relevant past medical, surgical, family and social history reviewed and updated as indicated. Interim medical history since our last visit reviewed. Allergies and medications reviewed and updated.  Review of Systems  Constitutional: Negative.   Respiratory: Negative.   Cardiovascular: Negative.     Per HPI unless specifically indicated above     Objective:    BP 118/80   Pulse 94   Wt 218 lb (98.9 kg)   SpO2 97%   BMI 35.73 kg/m   Wt Readings from Last 3 Encounters:  01/18/19 218 lb (98.9 kg)  07/12/18 210 lb 6.4 oz (95.4 kg)  12/29/17 195 lb (88.5 kg)    Physical Exam  Results for orders placed or performed in visit on 07/12/18   LP+ALT+AST Piccolo, Waived  Result Value Ref Range   ALT (SGPT) Piccolo, Waived 61 (H) 10 - 47 U/L   AST (SGOT) Piccolo, Waived 40 (H) 11 - 38 U/L   Cholesterol Piccolo, Waived 214 (H) <200 mg/dL   HDL Chol Piccolo, Waived 39 (L) >59 mg/dL   Triglycerides Piccolo,Waived 298 (H) <150 mg/dL   Chol/HDL Ratio Piccolo,Waive 5.4 (H) mg/dL   LDL Chol Calc Piccolo Waived 115 (H) <100 mg/dL   VLDL Chol Calc Piccolo,Waive 60 (H) <30 mg/dL      Assessment & Plan:   Problem List Items Addressed This Visit      Digestive   Fatty liver disease, nonalcoholic    Discussed weight with sedentary lifestyle and walking 10,000 steps a day and practicing COVID-19 guidelines        Endocrine   Hypothyroidism    The current medical regimen is effective;  continue present plan and medications.       Relevant Medications   levothyroxine (SYNTHROID, LEVOTHROID) 112 MCG tablet     Other   Anxiety    Using lorazepam appropriately and rarely      Relevant Medications   FLUoxetine (PROZAC) 20 MG tablet   Depression, recurrent (HCC)    The current medical regimen is effective;  continue present plan and medications.       Relevant Medications   FLUoxetine (PROZAC) 20 MG tablet      I discussed the assessment and treatment plan with the patient.  The patient was provided an opportunity to ask questions and all were answered. The patient agreed with the plan and demonstrated an understanding of the instructions.   The patient was advised to call back or seek an in-person evaluation if the symptoms worsen or if the condition fails to improve as anticipated.   I provided 21+ minutes of time during this encounter. Follow up plan: Return in about 3 months (around 04/19/2019) for Physical Exam.

## 2019-01-18 NOTE — Assessment & Plan Note (Signed)
Discussed weight with sedentary lifestyle and walking 10,000 steps a day and practicing COVID-19 guidelines

## 2019-01-18 NOTE — Assessment & Plan Note (Signed)
The current medical regimen is effective;  continue present plan and medications.  

## 2019-04-19 ENCOUNTER — Encounter: Payer: 59 | Admitting: Family Medicine

## 2019-04-28 IMAGING — MR MR HEAD WO/W CM
13 series · 48 of 48 positions shown · IV contrast (multihance)
Comparison: None.

CLINICAL DATA: Vertigo for 2 years, worse in [REDACTED] and March 2017.
Family history of multiple sclerosis.

EXAM:
MRI HEAD WITHOUT AND WITH CONTRAST
TECHNIQUE: Multiplanar, multiecho pulse sequences of the brain and surrounding
structures were obtained without and with intravenous contrast.
CONTRAST:  15mL MULTIHANCE GADOBENATE DIMEGLUMINE 529 MG/ML IV SOLN

[Series 2: T1 · sagittal · 5.0mm · 0.45mm/px · 1 of 28 slices shown (1 of 2)]
[im 1/28]
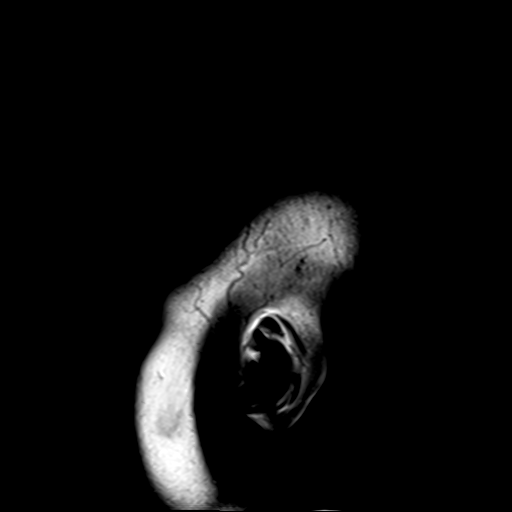

[Series 4: DWI · axial · 3.0mm · 1.80mm/px · z∈[-44,+113]mm · 4 of 55 slices shown (1 of 2)]
[im 1/55]
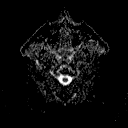
[im 19/55]
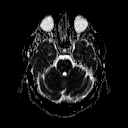
[im 37/55]
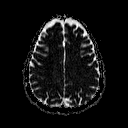
[im 55/55]
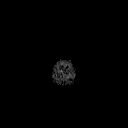

[Series 6: DWI · coronal · 3.0mm · 1.80mm/px · 3 of 47 slices shown (2 of 2)]
[im 1/47]
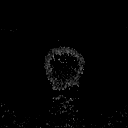
[im 24/47]
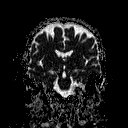
[im 47/47]
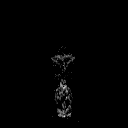

[Series 7: T2 · axial · 5.0mm · 0.60mm/px · z∈[-44,+113]mm · 2 of 26 slices shown (1 of 2)]
[im 1/26]
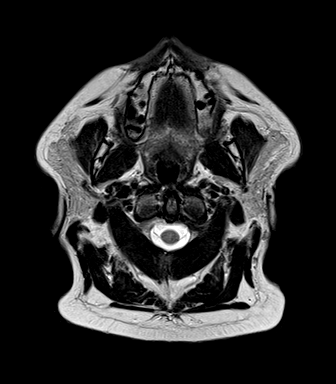
[im 26/26]
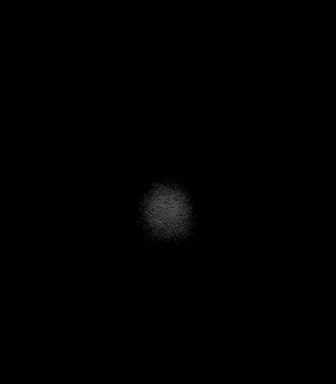

[Series 8: FLAIR · axial · 3.0mm · 0.45mm/px · z∈[-41,+110]mm · 3 of 53 slices shown (1 of 2)]
[im 1/53]
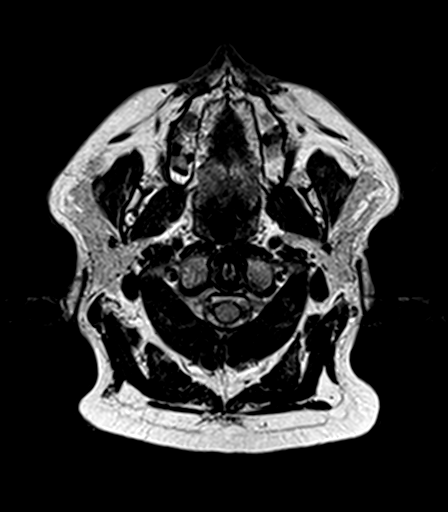
[im 27/53]
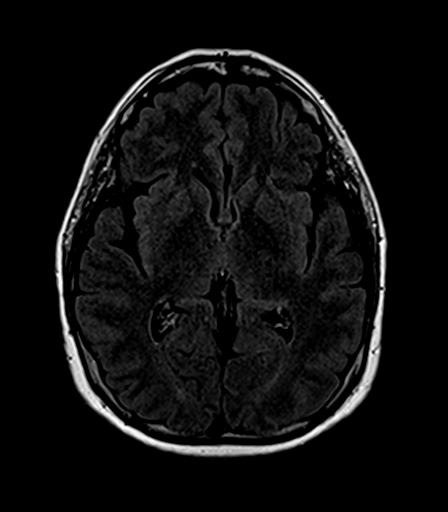
[im 53/53]
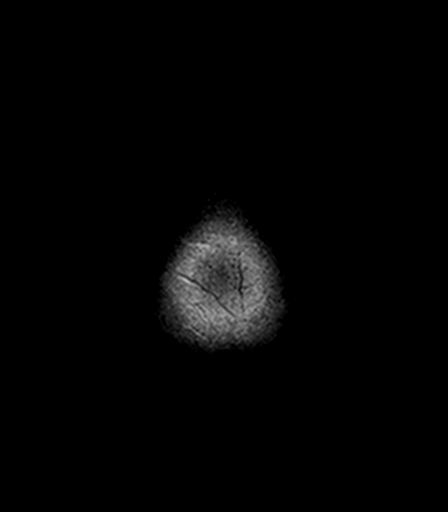

[Series 9: T2 · axial · 5.0mm · 0.45mm/px · z∈[-44,+113]mm · 2 of 26 slices shown (2 of 2)]
[im 1/26]
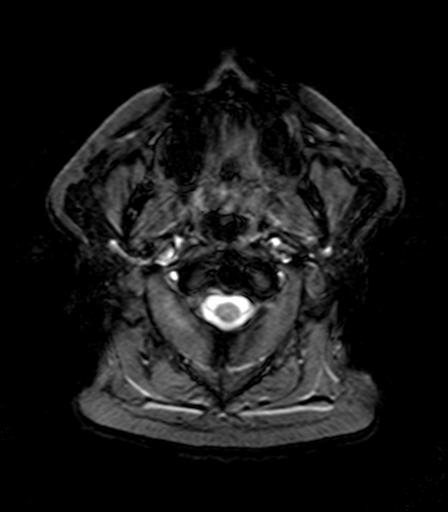
[im 26/26]
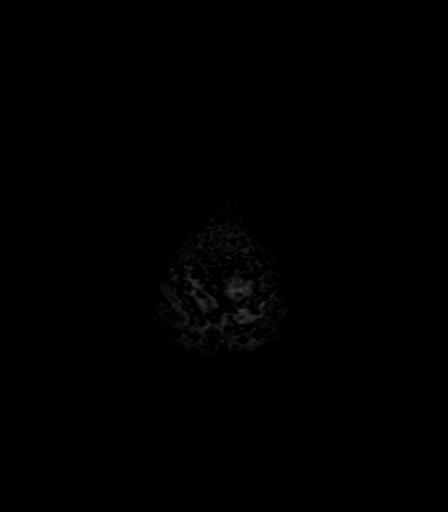

[Series 10: T1 · axial · 1.0mm · 1.00mm/px · z∈[-39,+115]mm · 10 of 160 slices shown (2 of 2)]
[im 1/160]
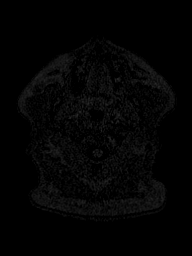
[im 18/160]
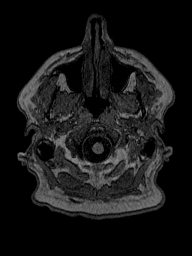
[im 36/160]
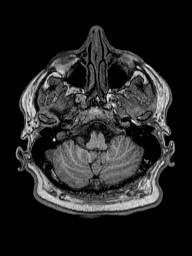
[im 54/160]
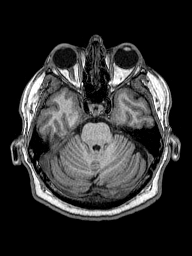
[im 71/160]
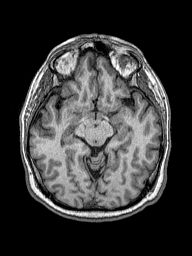
[im 89/160]
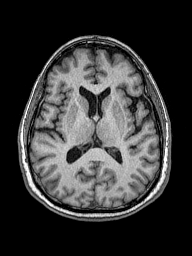
[im 107/160]
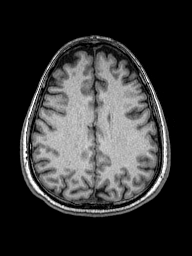
[im 124/160]
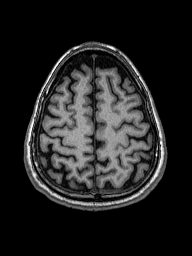
[im 142/160]
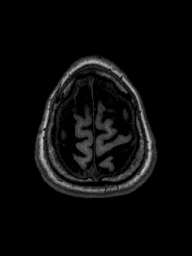
[im 160/160]
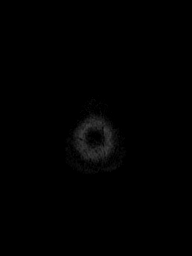

[Series 11: FLAIR · sagittal · 5.0mm · 0.43mm/px · 2 of 25 slices shown (2 of 2)]
[im 1/25]
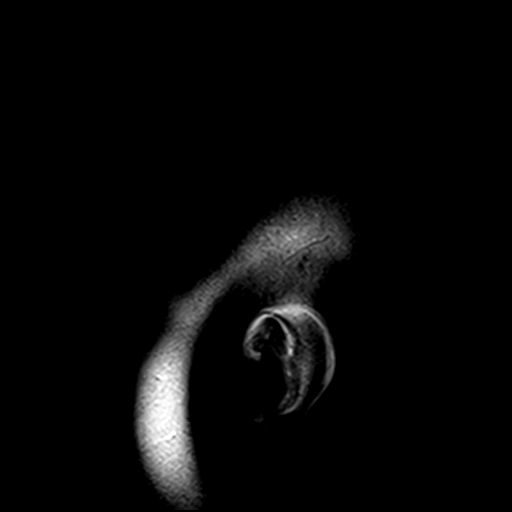
[im 25/25]
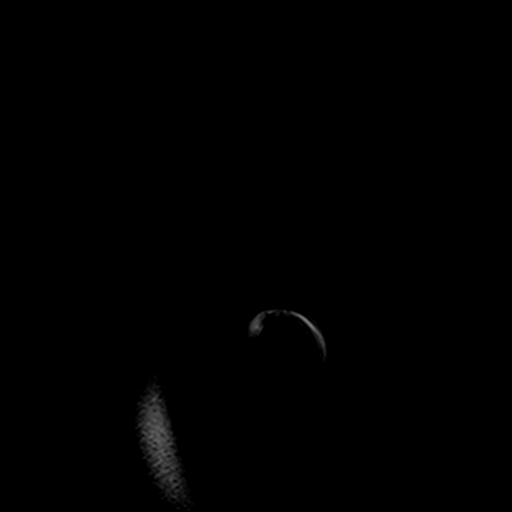

[Series 12: T2 post-contrast · coronal · 5.0mm · 0.49mm/px · 2 of 28 slices shown]
[im 1/28]
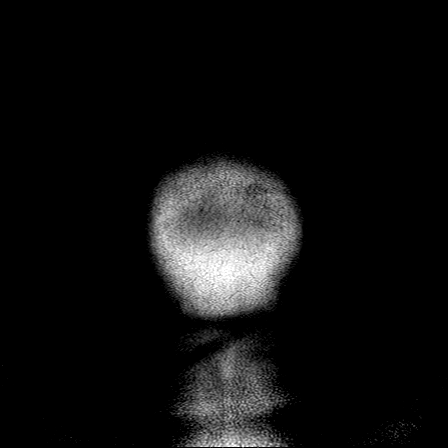
[im 28/28]
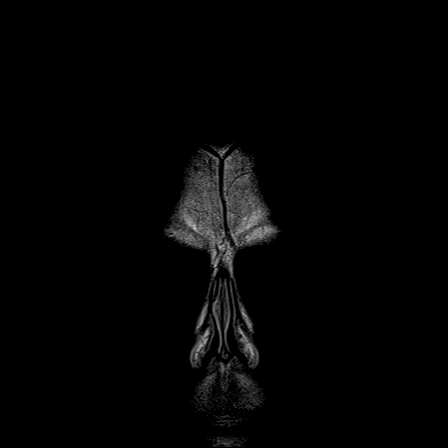

[Series 13: T1 post-contrast · axial · 1.0mm · 1.00mm/px · z∈[-39,+115]mm · 10 of 160 slices shown (1 of 2)]
[im 1/160]
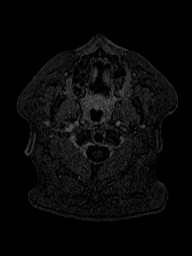
[im 18/160]
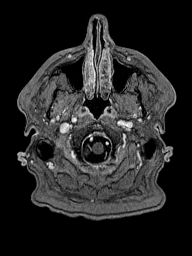
[im 36/160]
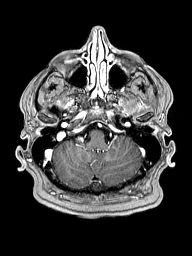
[im 54/160]
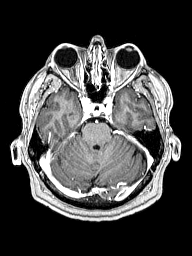
[im 71/160]
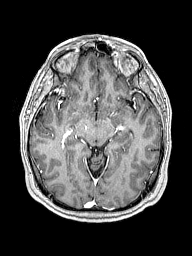
[im 89/160]
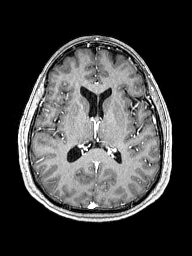
[im 107/160]
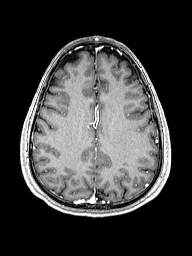
[im 124/160]
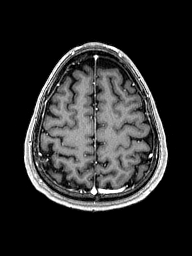
[im 142/160]
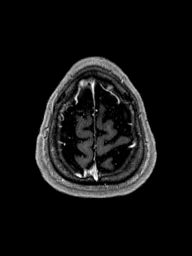
[im 160/160]
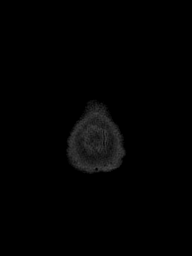

[Series 14: T1 post-contrast · coronal · 5.0mm · 0.43mm/px · 2 of 28 slices shown (2 of 2)]
[im 1/28]
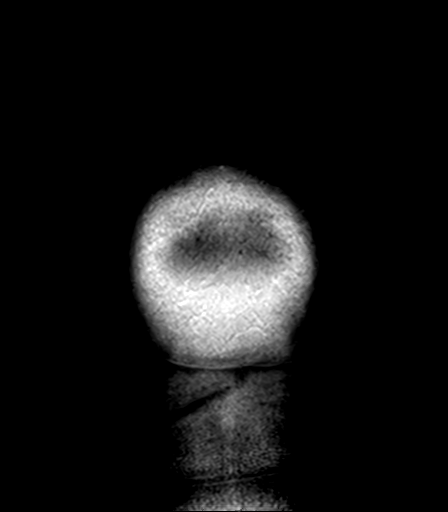
[im 28/28]
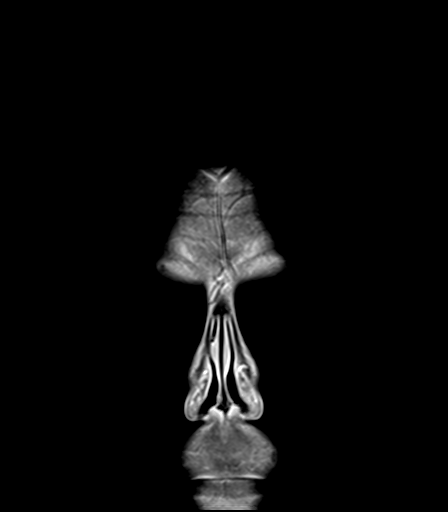

[Series 100: ax (id) · axial · 3.0mm · 1.80mm/px · z∈[-44,+113]mm · 4 of 55 slices shown]
[im 1/55]
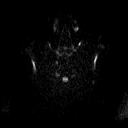
[im 19/55]
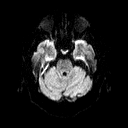
[im 37/55]
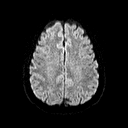
[im 55/55]
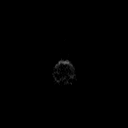

[Series 101: cor (id) · coronal · 3.0mm · 1.80mm/px · 3 of 47 slices shown]
[im 1/47]
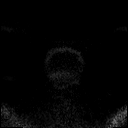
[im 24/47]
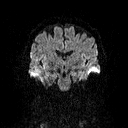
[im 47/47]
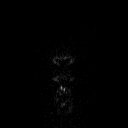

[48 of 48 positions shown; findings below may reference images not displayed]

FINDINGS: INTRACRANIAL CONTENTS: No reduced diffusion to suggest acute
ischemia or hyperacute demyelination. No susceptibility artifact to
suggest hemorrhage. The ventricles and sulci are normal for
patient's age. No suspicious parenchymal signal, masses, mass
effect. No abnormal intraparenchymal or extra-axial enhancement. No
abnormal extra-axial fluid collections. No extra-axial masses.

VASCULAR: Normal major intracranial vascular flow voids present at
skull base.

SKULL AND UPPER CERVICAL SPINE: No abnormal sellar expansion. No
suspicious calvarial bone marrow signal. Craniocervical junction
maintained.

SINUSES/ORBITS: Trace paranasal sinus mucosal thickening. Bilateral
maxillary mucosal retention cyst. No paranasal sinus air-fluid
levels. Mastoid air cells are well aerated. The included ocular
globes and orbital contents are non-suspicious.

OTHER: None.
IMPRESSION: Normal MRI of the head with and without contrast.

## 2019-04-30 ENCOUNTER — Encounter: Payer: Self-pay | Admitting: Family Medicine

## 2019-04-30 ENCOUNTER — Ambulatory Visit (INDEPENDENT_AMBULATORY_CARE_PROVIDER_SITE_OTHER): Payer: 59 | Admitting: Family Medicine

## 2019-04-30 ENCOUNTER — Other Ambulatory Visit: Payer: Self-pay

## 2019-04-30 VITALS — BP 106/72 | HR 99 | Wt 207.0 lb

## 2019-04-30 DIAGNOSIS — L989 Disorder of the skin and subcutaneous tissue, unspecified: Secondary | ICD-10-CM

## 2019-04-30 MED ORDER — CLOBETASOL PROPIONATE 0.05 % EX OINT: 1.0000 "application " | TOPICAL_OINTMENT | Freq: Two times a day (BID) | CUTANEOUS | 0 refills | Status: DC | PRN

## 2019-04-30 NOTE — Progress Notes (Signed)
BP 106/72   Pulse 99   Wt 207 lb (93.9 kg)   SpO2 96%   BMI 33.92 kg/m    Subjective:    Patient ID: Michael AmosFrank J Gillespie, male    DOB: September 05, 1966, 53 y.o.   MRN: [id number]  HPI: Michael Gillespie is a 53 y.o. male  Chief Complaint  Patient presents with  . Insect Bite    Left ankle, couple days ago. Red size of 50 cent piece. 2 'bites' in middle. Itchy.     . This visit was completed via WebEx due to the restrictions of the COVID-19 pandemic. All issues as above were discussed and addressed. Physical exam was done as above through visual confirmation on WebEx. If it was felt that the patient should be evaluated in the office, they were directed there. The patient verbally consented to this visit. . Location of the patient: home . Location of the provider: home . Those involved with this call:  . Provider: Roosvelt Maserachel , PA-C . CMA: Sheilah MinsJada Fox, CMA . Front Desk/Registration: Harriet PhoJoliza Johnson  . Time spent on call: 15 minutes with patient face to face via video conference. More than 50% of this time was spent in counseling and coordination of care. 5 minutes total spent in review of patient's record and preparation of their chart. I verified patient identity using two factors (patient name and date of birth). Patient consents verbally to being seen via telemedicine visit today.   Patient presents today to discuss an area on left ankle that appears to have two insect bite markings. Noticed the area about 2 days ago and the redness has spread since noticing. Mild itching, no burning. Unsure what could have bitten him. Has been applying hydrocortisone cream to it with minimal benefit. No drainage, heat, fevers, body aches sweats.   Relevant past medical, surgical, family and social history reviewed and updated as indicated. Interim medical history since our last visit reviewed. Allergies and medications reviewed and updated.  Review of Systems  Per HPI unless specifically indicated above      Objective:    BP 106/72   Pulse 99   Wt 207 lb (93.9 kg)   SpO2 96%   BMI 33.92 kg/m   Wt Readings from Last 3 Encounters:  04/30/19 207 lb (93.9 kg)  01/18/19 218 lb (98.9 kg)  07/12/18 210 lb 6.4 oz (95.4 kg)    Physical Exam Vitals signs and nursing note reviewed.  Constitutional:      General: He is not in acute distress.    Appearance: Normal appearance.  HENT:     Head: Atraumatic.     Right Ear: External ear normal.     Left Ear: External ear normal.     Nose: Nose normal. No congestion.     Mouth/Throat:     Mouth: Mucous membranes are moist.     Pharynx: Oropharynx is clear.  Eyes:     Extraocular Movements: Extraocular movements intact.     Conjunctiva/sclera: Conjunctivae normal.  Neck:     Musculoskeletal: Normal range of motion.  Pulmonary:     Effort: Pulmonary effort is normal. No respiratory distress.  Musculoskeletal: Normal range of motion.  Skin:    General: Skin is dry.     Comments: Two small ulcerated lesions left ankle with surrounding erythema, no edema, drainage, bleeding noted  Neurological:     Mental Status: He is oriented to person, place, and time.  Psychiatric:  Mood and Affect: Mood normal.        Thought Content: Thought content normal.        Judgment: Judgment normal.     Results for orders placed or performed in visit on 07/12/18  LP+ALT+AST Piccolo, Waived  Result Value Ref Range   ALT (SGPT) Piccolo, Waived 61 (H) 10 - 47 U/L   AST (SGOT) Piccolo, Waived 40 (H) 11 - 38 U/L   Cholesterol Piccolo, Waived 214 (H) <200 mg/dL   HDL Chol Piccolo, Waived 39 (L) >59 mg/dL   Triglycerides Piccolo,Waived 298 (H) <150 mg/dL   Chol/HDL Ratio Piccolo,Waive 5.4 (H) mg/dL   LDL Chol Calc Piccolo Waived 115 (H) <100 mg/dL   VLDL Chol Calc Piccolo,Waive 60 (H) <30 mg/dL      Assessment & Plan:   Problem List Items Addressed This Visit    None    Visit Diagnoses    Skin lesion    -  Primary   Does appear to be insect bites,  with some inflammatory/allergic response to the area. Clobetasol BID prn to area, neosporin and bandages. F/u if worsening       Follow up plan: Return if symptoms worsen or fail to improve.

## 2019-05-23 ENCOUNTER — Ambulatory Visit (INDEPENDENT_AMBULATORY_CARE_PROVIDER_SITE_OTHER): Payer: 59 | Admitting: Family Medicine

## 2019-05-23 ENCOUNTER — Encounter: Payer: Self-pay | Admitting: Family Medicine

## 2019-05-23 ENCOUNTER — Other Ambulatory Visit: Payer: Self-pay

## 2019-05-23 VITALS — BP 107/77 | HR 92 | Wt 207.0 lb

## 2019-05-23 DIAGNOSIS — F419 Anxiety disorder, unspecified: Secondary | ICD-10-CM

## 2019-05-23 DIAGNOSIS — F339 Major depressive disorder, recurrent, unspecified: Secondary | ICD-10-CM

## 2019-05-23 DIAGNOSIS — K760 Fatty (change of) liver, not elsewhere classified: Secondary | ICD-10-CM

## 2019-05-23 DIAGNOSIS — Z0000 Encounter for general adult medical examination without abnormal findings: Secondary | ICD-10-CM

## 2019-05-23 DIAGNOSIS — E039 Hypothyroidism, unspecified: Secondary | ICD-10-CM | POA: Diagnosis not present

## 2019-05-23 DIAGNOSIS — E291 Testicular hypofunction: Secondary | ICD-10-CM | POA: Diagnosis not present

## 2019-05-23 MED ORDER — LEVOTHYROXINE SODIUM 112 MCG PO TABS: 112.0000 ug | ORAL_TABLET | Freq: Every day | ORAL | 3 refills | Status: DC

## 2019-05-23 MED ORDER — FLUOXETINE HCL 20 MG PO TABS: 20.0000 mg | ORAL_TABLET | Freq: Every day | ORAL | 3 refills | Status: DC

## 2019-05-23 NOTE — Assessment & Plan Note (Signed)
Elected for no treatment

## 2019-05-23 NOTE — Assessment & Plan Note (Signed)
Stable has not used lorazepam in almost 6 months

## 2019-05-23 NOTE — Progress Notes (Signed)
BP 107/77    Pulse 92    Wt 207 lb (93.9 kg)    SpO2 97%    BMI 33.92 kg/m    Subjective:    Patient ID: Michael Gillespie, male    DOB: Sep 12, 1966, 53 y.o.   MRN: [id number]  HPI: Michael THERIEN is a 53 y.o. male  Med check  Relevant past medical, surgical, family and social history reviewed and updated as indicated. Interim medical history since our last visit reviewed. Allergies and medications reviewed and updated.  Review of Systems  Constitutional: Negative.   HENT: Negative.   Eyes: Negative.   Respiratory: Negative.   Cardiovascular: Negative.   Gastrointestinal: Negative.   Endocrine: Negative.   Genitourinary: Negative.   Musculoskeletal: Negative.   Skin: Negative.   Allergic/Immunologic: Negative.   Neurological: Negative.   Hematological: Negative.   Psychiatric/Behavioral: Negative.     Per HPI unless specifically indicated above     Objective:    BP 107/77    Pulse 92    Wt 207 lb (93.9 kg)    SpO2 97%    BMI 33.92 kg/m   Wt Readings from Last 3 Encounters:  05/23/19 207 lb (93.9 kg)  04/30/19 207 lb (93.9 kg)  01/18/19 218 lb (98.9 kg)    Physical Exam  Results for orders placed or performed in visit on 07/12/18  LP+ALT+AST Piccolo, Waived  Result Value Ref Range   ALT (SGPT) Piccolo, Waived 61 (H) 10 - 47 U/L   AST (SGOT) Piccolo, Waived 40 (H) 11 - 38 U/L   Cholesterol Piccolo, Waived 214 (H) <200 mg/dL   HDL Chol Piccolo, Waived 39 (L) >59 mg/dL   Triglycerides Piccolo,Waived 298 (H) <150 mg/dL   Chol/HDL Ratio Piccolo,Waive 5.4 (H) mg/dL   LDL Chol Calc Piccolo Waived 115 (H) <100 mg/dL   VLDL Chol Calc Piccolo,Waive 60 (H) <30 mg/dL      Assessment & Plan:   Problem List Items Addressed This Visit      Digestive   Fatty liver disease, nonalcoholic    The current medical regimen is effective;  continue present plan and medications.         Endocrine   Hypothyroidism    The current medical regimen is effective;  continue  present plan and medications.       Relevant Medications   levothyroxine (SYNTHROID) 112 MCG tablet   Hypogonadism in male    Elected for no treatment        Other   Anxiety    Stable has not used lorazepam in almost 6 months      Relevant Medications   FLUoxetine (PROZAC) 20 MG tablet   Depression, recurrent (HCC)    The current medical regimen is effective;  continue present plan and medications.       Relevant Medications   FLUoxetine (PROZAC) 20 MG tablet    Other Visit Diagnoses    PE (physical exam), annual    -  Primary   Relevant Orders   Comprehensive metabolic panel   Lipid panel   CBC with Differential/Platelet   TSH   Urinalysis, Routine w reflex microscopic   PSA     Telemedicine using audio/video telecommunications for a synchronous communication visit. Today's visit due to COVID-19 isolation precautions I connected with and verified that I am speaking with the correct person using two identifiers.   I discussed the limitations, risks, security and privacy concerns of performing an evaluation and management service  by telecommunication and the availability of in person appointments. I also discussed with the patient that there may be a patient responsible charge related to this service. The patient expressed understanding and agreed to proceed. The patients location is home. I am at home.   I discussed the assessment and treatment plan with the patient. The patient was provided an opportunity to ask questions and all were answered. The patient agreed with the plan and demonstrated an understanding of the instructions.   The patient was advised to call back or seek an in-person evaluation if the symptoms worsen or if the condition fails to improve as anticipated.   I provided 21+ minutes of time during this encounter.  Follow up plan: Hands on PE apt will be made Return in about 1 year (around 05/22/2020) for Physical Exam.

## 2019-05-23 NOTE — Assessment & Plan Note (Signed)
The current medical regimen is effective;  continue present plan and medications.  

## 2019-06-15 ENCOUNTER — Ambulatory Visit (INDEPENDENT_AMBULATORY_CARE_PROVIDER_SITE_OTHER): Payer: 59 | Admitting: Family Medicine

## 2019-06-15 ENCOUNTER — Encounter: Payer: Self-pay | Admitting: Family Medicine

## 2019-06-15 ENCOUNTER — Other Ambulatory Visit: Payer: Self-pay

## 2019-06-15 DIAGNOSIS — M25521 Pain in right elbow: Secondary | ICD-10-CM | POA: Diagnosis not present

## 2019-06-15 MED ORDER — SULFAMETHOXAZOLE-TRIMETHOPRIM 800-160 MG PO TABS: 1.0000 | ORAL_TABLET | Freq: Two times a day (BID) | ORAL | 0 refills | Status: DC

## 2019-06-15 MED ORDER — PREDNISONE 20 MG PO TABS: 40.0000 mg | ORAL_TABLET | Freq: Every day | ORAL | 0 refills | Status: DC

## 2019-06-15 NOTE — Progress Notes (Signed)
    There were no vitals taken for this visit.   Subjective:    Patient ID: Michael Gillespie, male    DOB: 1966/08/20, 53 y.o.   MRN: [id number]  HPI: Michael Gillespie is a 53 y.o. male  Chief Complaint  Patient presents with  . Joint Swelling    right elbow. x 2 days   . This visit was completed via telephone due to the restrictions of the COVID-19 pandemic. All issues as above were discussed and addressed. Physical exam was done as above through visual confirmation on telephone. If it was felt that the patient should be evaluated in the office, they were directed there. The patient verbally consented to this visit. . Location of the patient: home . Location of the provider: work . Those involved with this call:  . Provider: Merrie Roof, PA-C . CMA: Lesle Chris, [place] . Front Desk/Registration: Jill Side  . Time spent on call: 15 minutes with patient face to face via video conference. More than 50% of this time was spent in counseling and coordination of care. 5 minutes total spent in review of patient's record and preparation of their chart.  2 days of redness, swelling, and tenderness in right elbow/forearm. 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 Using ibuprofen, compression sleeves, and ice with minimal relief. No radiation of pain, numbness or tingling, mobility issues, fevers, chills.   Relevant past medical, surgical, family and social history reviewed and updated as indicated. Interim medical history since our last visit reviewed. Allergies and medications reviewed and updated.  Review of Systems  Per HPI unless specifically indicated above     Objective:    There were no vitals taken for this visit.  Wt Readings from Last 3 Encounters:  05/23/19 207 lb (93.9 kg)  04/30/19 207 lb (93.9 kg)  01/18/19 218 lb (98.9 kg)    Physical Exam  Unable to  perform PE as there were technical difficulties with the video technology used for today's visit, but patient will send pictures through mychart tonight to better assist with diagnosis.   Results for orders placed or performed in visit on 07/12/18  LP+ALT+AST Piccolo, Waived  Result Value Ref Range   ALT (SGPT) Piccolo, Waived 61 (H) 10 - 47 U/L   AST (SGOT) Piccolo, Waived 40 (H) 11 - 38 U/L   Cholesterol Piccolo, Waived 214 (H) <200 mg/dL   HDL Chol Piccolo, Waived 39 (L) >59 mg/dL   Triglycerides Piccolo,Waived 298 (H) <150 mg/dL   Chol/HDL Ratio Piccolo,Waive 5.4 (H) mg/dL   LDL Chol Calc Piccolo Waived 115 (H) <100 mg/dL   VLDL Chol Calc Piccolo,Waive 60 (H) <30 mg/dL      Assessment & Plan:   Problem List Items Addressed This Visit    None    Visit Diagnoses    Right elbow pain    -  Primary   Unclear if cellulitis, tenodonitis, gout, etc. Will await pictures through mychart and tx with RICE, prednisone, and abx to cover broad base of issues      Strict return precautions given  Follow up plan: Return if symptoms worsen or fail to improve.

## 2019-10-30 ENCOUNTER — Telehealth: Payer: Self-pay

## 2019-10-30 ENCOUNTER — Other Ambulatory Visit: Payer: Self-pay | Admitting: Nurse Practitioner

## 2019-10-30 DIAGNOSIS — F339 Major depressive disorder, recurrent, unspecified: Secondary | ICD-10-CM

## 2019-10-30 MED ORDER — FLUOXETINE HCL 20 MG PO CAPS: 20.0000 mg | ORAL_CAPSULE | Freq: Every day | ORAL | 3 refills | Status: DC

## 2019-10-30 NOTE — Telephone Encounter (Signed)
Capsule script sent

## 2019-10-30 NOTE — Telephone Encounter (Signed)
Fax from pharmacy.  Fluoxetine tablets are not covered by patient's insurance. Requesting 20 mg capsules instead.

## 2019-12-25 ENCOUNTER — Encounter: Payer: Self-pay | Admitting: Family Medicine

## 2019-12-26 ENCOUNTER — Encounter: Payer: Self-pay | Admitting: Family Medicine

## 2020-01-02 ENCOUNTER — Encounter: Payer: Self-pay | Admitting: Family Medicine

## 2020-01-02 ENCOUNTER — Other Ambulatory Visit: Payer: Self-pay

## 2020-01-02 ENCOUNTER — Ambulatory Visit (INDEPENDENT_AMBULATORY_CARE_PROVIDER_SITE_OTHER): Payer: 59 | Admitting: Family Medicine

## 2020-01-02 VITALS — BP 113/77 | HR 84 | Temp 98.0°F | Ht 64.5 in | Wt 215.0 lb

## 2020-01-02 DIAGNOSIS — F339 Major depressive disorder, recurrent, unspecified: Secondary | ICD-10-CM

## 2020-01-02 DIAGNOSIS — E7800 Pure hypercholesterolemia, unspecified: Secondary | ICD-10-CM | POA: Diagnosis not present

## 2020-01-02 DIAGNOSIS — Z1211 Encounter for screening for malignant neoplasm of colon: Secondary | ICD-10-CM

## 2020-01-02 DIAGNOSIS — Z0000 Encounter for general adult medical examination without abnormal findings: Secondary | ICD-10-CM

## 2020-01-02 DIAGNOSIS — E291 Testicular hypofunction: Secondary | ICD-10-CM

## 2020-01-02 DIAGNOSIS — Z114 Encounter for screening for human immunodeficiency virus [HIV]: Secondary | ICD-10-CM

## 2020-01-02 DIAGNOSIS — E039 Hypothyroidism, unspecified: Secondary | ICD-10-CM

## 2020-01-02 DIAGNOSIS — F5101 Primary insomnia: Secondary | ICD-10-CM

## 2020-01-02 LAB — UA/M W/RFLX CULTURE, ROUTINE
Bilirubin, UA: NEGATIVE
Glucose, UA: NEGATIVE
Ketones, UA: NEGATIVE
Leukocytes,UA: NEGATIVE
Nitrite, UA: NEGATIVE
RBC, UA: NEGATIVE
Specific Gravity, UA: >1.03 — ABNORMAL HIGH (ref 1.005–1.030)
Urobilinogen, Ur: 0.2 mg/dL (ref 0.2–1.0)
pH, UA: 5.5 (ref 5.0–7.5)

## 2020-01-02 LAB — MICROSCOPIC EXAMINATION
Bacteria, UA: NONE SEEN
RBC, Urine: NONE SEEN /hpf (ref 0–2)
WBC, UA: NONE SEEN /hpf (ref 0–5)

## 2020-01-02 MED ORDER — QUETIAPINE FUMARATE 25 MG PO TABS: 25.0000 mg | ORAL_TABLET | Freq: Every evening | ORAL | 0 refills | Status: DC | PRN

## 2020-01-02 NOTE — Assessment & Plan Note (Signed)
Chronic, stable and well controlled. Continue current regimen °

## 2020-01-02 NOTE — Assessment & Plan Note (Signed)
Recheck lipids, adjust as needed. Continue working on diet and exercise 

## 2020-01-02 NOTE — Assessment & Plan Note (Signed)
Options reviewed, pt agreeable to low dose seroquel in addition to melatonin, sleep hygiene and weight loss

## 2020-01-02 NOTE — Progress Notes (Signed)
BP 113/77   Pulse 84   Temp 98 F (36.7 C) (Oral)   Ht 5' 4.5" (1.638 m)   Wt 215 lb (97.5 kg)   SpO2 97%   BMI 36.33 kg/m    Subjective:    Patient ID: Michael Gillespie, male    DOB: March 25, 1966, 54 y.o.   MRN: [id number]  HPI: Michael Gillespie is a 54 y.o. male presenting on 01/02/2020 for comprehensive medical examination. Current medical complaints include:see below  Depression - stable on prozac other than his sleep concerns which have been ongoing for years. Has trouble shutting his mind down at night, getting 3-5 hours per night. Trying melatonin with no relief, has tried sleep schedule changes, does not keep TV or lights on, etc with no benefit. Denies snoring, apneic episodes noted.   Hypothyroidism - stable, taking synthroid without issue. Denies new concerns or sxs.   He currently lives with: Interim Problems from his last visit: no  Depression Screen done today and results listed below:  Depression screen [facility] 2/9 01/02/2020 01/18/2019 12/29/2017 11/08/2017 10/07/2017  Decreased Interest 0 0 0 0 0  Down, Depressed, Hopeless 0 1 0 0 0  PHQ - 2 Score 0 1 0 0 0  Altered sleeping 3 - 0 2 3  Tired, decreased energy 2 - 1 1 2   Change in appetite 1 - - 1 1  Feeling bad or failure about yourself  0 - 0 0 0  Trouble concentrating 0 - 0 0 0  Moving slowly or fidgety/restless 0 - 0 0 0  Suicidal thoughts 0 - 0 0 0  PHQ-9 Score 6 - 1 4 6     The patient does not have a history of falls. I did complete a risk assessment for falls. A plan of care for falls was documented.   Past Medical History:  Past Medical History:  Diagnosis Date  . Anxiety   . GERD (gastroesophageal reflux disease)   . History of nephrolithiasis   . Hyperlipidemia   . Hypothyroidism   . Low testosterone     Surgical History:  History reviewed. No pertinent surgical history.  Medications:  Current Outpatient Medications on File Prior to Visit  Medication Sig  . FLUoxetine (PROZAC) 20 MG capsule  Take 1 capsule (20 mg total) by mouth daily.  Marland Kitchen levothyroxine (SYNTHROID) 112 MCG tablet Take 1 tablet (112 mcg total) by mouth daily before breakfast.  . LORazepam (ATIVAN) 1 MG tablet TAKE 1 TABLET BY MOUTH DAILY AS NEEDED FOR ANXIETY (Patient not taking: Reported on 01/02/2020)   No current facility-administered medications on file prior to visit.    Allergies:  No Known Allergies  Social History:  Social History   Socioeconomic History  . Marital status: Married    Spouse name: Not on file  . Number of children: Not on file  . Years of education: Not on file  . Highest education level: Not on file  Occupational History  . Not on file  Tobacco Use  . Smoking status: Never Smoker  . Smokeless tobacco: Never Used  Substance and Sexual Activity  . Alcohol use: No  . Drug use: No  . Sexual activity: Not on file  Other Topics Concern  . Not on file  Social History Narrative  . Not on file   Social Determinants of Health   Financial Resource Strain:   . Difficulty of Paying Living Expenses:   Food Insecurity:   . Worried About [employer] of  Food in the Last Year:   . Ran Out of Food in the Last Year:   Transportation Needs:   . Freight forwarder (Medical):   Marland Kitchen Lack of Transportation (Non-Medical):   Physical Activity:   . Days of Exercise per Week:   . Minutes of Exercise per Session:   Stress:   . Feeling of Stress :   Social Connections:   . Frequency of Communication with Friends and Family:   . Frequency of Social Gatherings with Friends and Family:   . Attends Religious Services:   . Active Member of Clubs or Organizations:   . Attends Banker Meetings:   Marland Kitchen Marital Status:   Intimate Partner Violence:   . Fear of Current or Ex-Partner:   . Emotionally Abused:   Marland Kitchen Physically Abused:   . Sexually Abused:    Social History   Tobacco Use  Smoking Status Never Smoker  Smokeless Tobacco Never Used   Social History   Substance and  Sexual Activity  Alcohol Use No    Family History:  Family History  Problem Relation Age of Onset  . Mental illness Mother   . Crohn's disease Mother   . Cancer Father        prostate  . Stroke Maternal Grandmother   . Bladder Cancer Neg Hx   . Kidney cancer Neg Hx     Past medical history, surgical history, medications, allergies, family history and social history reviewed with patient today and changes made to appropriate areas of the chart.   Review of Systems - General ROS: negative Psychological ROS: positive for - sleep disturbances Ophthalmic ROS: negative ENT ROS: negative Allergy and Immunology ROS: negative Hematological and Lymphatic ROS: negative Endocrine ROS: negative Breast ROS: negative for breast lumps Respiratory ROS: no cough, shortness of breath, or wheezing Cardiovascular ROS: no chest pain or dyspnea on exertion Gastrointestinal ROS: no abdominal pain, change in bowel habits, or black or bloody stools Genito-Urinary ROS: no dysuria, trouble voiding, or hematuria Musculoskeletal ROS: negative Neurological ROS: no TIA or stroke symptoms Dermatological ROS: negative All other ROS negative except what is listed above and in the HPI.      Objective:    BP 113/77   Pulse 84   Temp 98 F (36.7 C) (Oral)   Ht 5' 4.5" (1.638 m)   Wt 215 lb (97.5 kg)   SpO2 97%   BMI 36.33 kg/m   Wt Readings from Last 3 Encounters:  01/02/20 215 lb (97.5 kg)  05/23/19 207 lb (93.9 kg)  04/30/19 207 lb (93.9 kg)    Physical Exam  Results for orders placed or performed in visit on 07/12/18  LP+ALT+AST Piccolo, Waived  Result Value Ref Range   ALT (SGPT) Piccolo, Waived 61 (H) 10 - 47 U/L   AST (SGOT) Piccolo, Waived 40 (H) 11 - 38 U/L   Cholesterol Piccolo, Waived 214 (H) <200 mg/dL   HDL Chol Piccolo, Waived 39 (L) >59 mg/dL   Triglycerides Piccolo,Waived 298 (H) <150 mg/dL   Chol/HDL Ratio Piccolo,Waive 5.4 (H) mg/dL   LDL Chol Calc Piccolo Waived 115 (H)  <100 mg/dL   VLDL Chol Calc Piccolo,Waive 60 (H) <30 mg/dL      Assessment & Plan:   Problem List Items Addressed This Visit      Endocrine   Hypothyroidism    Recheck TSH, adjust if needed. Continue current regimen      Relevant Orders   TSH   Hypogonadism in  male    Recheck PSA, asymptomatic. Continue to monitor      Relevant Orders   UA/M w/rflx Culture, Routine   PSA     Other   Hyperlipidemia - Primary    Recheck lipids, adjust as needed. Continue working on diet and exercise      Relevant Orders   Comprehensive metabolic panel   Lipid Panel w/o Chol/HDL Ratio   Depression, recurrent (HCC)    Chronic, stable and well controlled. Continue current regimen      Insomnia    Options reviewed, pt agreeable to low dose seroquel in addition to melatonin, sleep hygiene and weight loss       Other Visit Diagnoses    Annual physical exam       Relevant Orders   CBC with Differential/Platelet   Encounter for screening for HIV       Relevant Orders   HIV Antibody (routine testing w rflx)   Screening for colon cancer       Relevant Orders   Cologuard       Discussed aspirin prophylaxis for myocardial infarction prevention and decision was it was not indicated  LABORATORY TESTING:  Health maintenance labs ordered today as discussed above.   The natural history of prostate cancer and ongoing controversy regarding screening and potential treatment outcomes of prostate cancer has been discussed with the patient. The meaning of a false positive PSA and a false negative PSA has been discussed. He indicates understanding of the limitations of this screening test and wishes to proceed with screening PSA testing.   IMMUNIZATIONS:   - Tdap: Tetanus vaccination status reviewed: postponed. - Influenza: Refused  SCREENING: - Colonoscopy: cologuard ordered  Discussed with patient purpose of the colonoscopy is to detect colon cancer at curable precancerous or early stages     PATIENT COUNSELING:    Sexuality: Discussed sexually transmitted diseases, partner selection, use of condoms, avoidance of unintended pregnancy  and contraceptive alternatives.   Advised to avoid cigarette smoking.  I discussed with the patient that most people either abstain from alcohol or drink within safe limits (<=14/week and <=4 drinks/occasion for males, <=7/weeks and <= 3 drinks/occasion for females) and that the risk for alcohol disorders and other health effects rises proportionally with the number of drinks per week and how often a drinker exceeds daily limits.  Discussed cessation/primary prevention of drug use and availability of treatment for abuse.   Diet: Encouraged to adjust caloric intake to maintain  or achieve ideal body weight, to reduce intake of dietary saturated fat and total fat, to limit sodium intake by avoiding high sodium foods and not adding table salt, and to maintain adequate dietary potassium and calcium preferably from fresh fruits, vegetables, and low-fat dairy products.    stressed the importance of regular exercise  Injury prevention: Discussed safety belts, safety helmets, smoke detector, smoking near bedding or upholstery.   Dental health: Discussed importance of regular tooth brushing, flossing, and dental visits.   Follow up plan: NEXT PREVENTATIVE PHYSICAL DUE IN 1 YEAR. Return in about 4 weeks (around 01/30/2020) for Sleep f/u.

## 2020-01-02 NOTE — Assessment & Plan Note (Signed)
Recheck PSA, asymptomatic. Continue to monitor

## 2020-01-02 NOTE — Assessment & Plan Note (Signed)
Recheck TSH, adjust if needed. Continue current regimen 

## 2020-01-03 ENCOUNTER — Other Ambulatory Visit: Payer: Self-pay | Admitting: Family Medicine

## 2020-01-03 ENCOUNTER — Telehealth: Payer: Self-pay

## 2020-01-03 ENCOUNTER — Encounter: Payer: Self-pay | Admitting: Family Medicine

## 2020-01-03 DIAGNOSIS — E7800 Pure hypercholesterolemia, unspecified: Secondary | ICD-10-CM

## 2020-01-03 DIAGNOSIS — K760 Fatty (change of) liver, not elsewhere classified: Secondary | ICD-10-CM

## 2020-01-03 DIAGNOSIS — R7309 Other abnormal glucose: Secondary | ICD-10-CM

## 2020-01-03 LAB — CBC WITH DIFFERENTIAL/PLATELET
Basophils Absolute: 0 10*3/uL (ref 0.0–0.2)
Basos: 1 %
EOS (ABSOLUTE): 0.3 10*3/uL (ref 0.0–0.4)
Eos: 5 %
Hematocrit: 43.5 % (ref 37.5–51.0)
Hemoglobin: 14.7 g/dL (ref 13.0–17.7)
Immature Grans (Abs): 0.1 10*3/uL (ref 0.0–0.1)
Immature Granulocytes: 1 %
Lymphocytes Absolute: 1.8 10*3/uL (ref 0.7–3.1)
Lymphs: 30 %
MCH: 30.6 pg (ref 26.6–33.0)
MCHC: 33.8 g/dL (ref 31.5–35.7)
MCV: 91 fL (ref 79–97)
Monocytes Absolute: 0.5 10*3/uL (ref 0.1–0.9)
Monocytes: 9 %
Neutrophils Absolute: 3.2 10*3/uL (ref 1.4–7.0)
Neutrophils: 54 %
Platelets: 207 10*3/uL (ref 150–450)
RBC: 4.8 x10E6/uL (ref 4.14–5.80)
RDW: 13 % (ref 11.6–15.4)
WBC: 5.9 10*3/uL (ref 3.4–10.8)

## 2020-01-03 LAB — COMPREHENSIVE METABOLIC PANEL
ALT: 90 IU/L — ABNORMAL HIGH (ref 0–44)
AST: 44 IU/L — ABNORMAL HIGH (ref 0–40)
Albumin/Globulin Ratio: 1.9 (ref 1.2–2.2)
Albumin: 4.6 g/dL (ref 3.8–4.9)
Alkaline Phosphatase: 81 IU/L (ref 39–117)
BUN/Creatinine Ratio: 14 (ref 9–20)
BUN: 17 mg/dL (ref 6–24)
Bilirubin Total: 0.2 mg/dL (ref 0.0–1.2)
CO2: 21 mmol/L (ref 20–29)
Calcium: 9.1 mg/dL (ref 8.7–10.2)
Chloride: 103 mmol/L (ref 96–106)
Creatinine, Ser: 1.23 mg/dL (ref 0.76–1.27)
GFR calc Af Amer: 78 mL/min/{1.73_m2} (ref >59)
GFR calc non Af Amer: 67 mL/min/{1.73_m2} (ref >59)
Globulin, Total: 2.4 g/dL (ref 1.5–4.5)
Glucose: 127 mg/dL — ABNORMAL HIGH (ref 65–99)
Potassium: 4.5 mmol/L (ref 3.5–5.2)
Sodium: 140 mmol/L (ref 134–144)
Total Protein: 7 g/dL (ref 6.0–8.5)

## 2020-01-03 LAB — HIV ANTIBODY (ROUTINE TESTING W REFLEX): HIV Screen 4th Generation wRfx: NONREACTIVE

## 2020-01-03 LAB — LIPID PANEL W/O CHOL/HDL RATIO
Cholesterol, Total: 230 mg/dL — ABNORMAL HIGH (ref 100–199)
HDL: 32 mg/dL — ABNORMAL LOW (ref >39)
LDL Chol Calc (NIH): 131 mg/dL — ABNORMAL HIGH (ref 0–99)
Triglycerides: 373 mg/dL — ABNORMAL HIGH (ref 0–149)
VLDL Cholesterol Cal: 67 mg/dL — ABNORMAL HIGH (ref 5–40)

## 2020-01-03 LAB — TSH: TSH: 3.67 u[IU]/mL (ref 0.450–4.500)

## 2020-01-03 LAB — PSA: Prostate Specific Ag, Serum: 0.9 ng/mL (ref 0.0–4.0)

## 2020-01-03 NOTE — Telephone Encounter (Signed)
Called patient to let him know that his paperwork is ready for pick up. LVM for patient to return call or to just come by to pick the paper up.

## 2020-01-03 NOTE — Telephone Encounter (Signed)
Patient came by and signed the form. Rozell Searing faxed the form over to the fax number on the form.

## 2020-04-25 LAB — HM DIABETES EYE EXAM

## 2020-05-26 ENCOUNTER — Encounter: Payer: Self-pay | Admitting: Family Medicine

## 2020-05-27 ENCOUNTER — Other Ambulatory Visit: Payer: Self-pay | Admitting: Nurse Practitioner

## 2020-05-27 ENCOUNTER — Other Ambulatory Visit: Payer: Self-pay

## 2020-05-27 DIAGNOSIS — E039 Hypothyroidism, unspecified: Secondary | ICD-10-CM

## 2020-05-27 MED ORDER — LEVOTHYROXINE SODIUM 112 MCG PO TABS: 112.0000 ug | ORAL_TABLET | Freq: Every day | ORAL | 3 refills | Status: DC

## 2020-05-27 MED ORDER — FLUOXETINE HCL 20 MG PO CAPS: 20.0000 mg | ORAL_CAPSULE | Freq: Every day | ORAL | 3 refills | Status: DC

## 2020-05-27 NOTE — Telephone Encounter (Signed)
[facility] faxes Rx refill request on Levothyroxine 0.112mg 

## 2020-05-27 NOTE — Telephone Encounter (Signed)
PT has lab apt on 06/03/2020 and stated he only has 2 days of the Fluoxetine 20mg  left and would like refill.

## 2020-05-27 NOTE — Telephone Encounter (Signed)
Pt verbalize and confirmed understating.

## 2020-05-27 NOTE — Telephone Encounter (Signed)
Sent in. Thank you

## 2020-05-28 ENCOUNTER — Other Ambulatory Visit: Payer: Self-pay | Admitting: Family Medicine

## 2020-05-28 MED ORDER — FLUOXETINE HCL 20 MG PO TABS: 20.0000 mg | ORAL_TABLET | Freq: Every day | ORAL | 1 refills | Status: DC

## 2020-06-03 ENCOUNTER — Other Ambulatory Visit: Payer: 59

## 2020-06-03 ENCOUNTER — Other Ambulatory Visit: Payer: Self-pay

## 2020-06-03 DIAGNOSIS — E7800 Pure hypercholesterolemia, unspecified: Secondary | ICD-10-CM

## 2020-06-03 DIAGNOSIS — K760 Fatty (change of) liver, not elsewhere classified: Secondary | ICD-10-CM

## 2020-06-03 DIAGNOSIS — R7309 Other abnormal glucose: Secondary | ICD-10-CM

## 2020-06-04 ENCOUNTER — Telehealth: Payer: Self-pay | Admitting: Family Medicine

## 2020-06-04 ENCOUNTER — Encounter: Payer: Self-pay | Admitting: Family Medicine

## 2020-06-04 LAB — LIPID PANEL W/O CHOL/HDL RATIO
Cholesterol, Total: 249 mg/dL — ABNORMAL HIGH (ref 100–199)
HDL: 29 mg/dL — ABNORMAL LOW (ref >39)
LDL Chol Calc (NIH): 146 mg/dL — ABNORMAL HIGH (ref 0–99)
Triglycerides: 396 mg/dL — ABNORMAL HIGH (ref 0–149)
VLDL Cholesterol Cal: 74 mg/dL — ABNORMAL HIGH (ref 5–40)

## 2020-06-04 LAB — COMPREHENSIVE METABOLIC PANEL
ALT: 114 IU/L — ABNORMAL HIGH (ref 0–44)
AST: 79 IU/L — ABNORMAL HIGH (ref 0–40)
Albumin/Globulin Ratio: 1.7 (ref 1.2–2.2)
Albumin: 4.3 g/dL (ref 3.8–4.9)
Alkaline Phosphatase: 77 IU/L (ref 48–121)
BUN/Creatinine Ratio: 13 (ref 9–20)
BUN: 15 mg/dL (ref 6–24)
Bilirubin Total: 0.4 mg/dL (ref 0.0–1.2)
CO2: 23 mmol/L (ref 20–29)
Calcium: 9 mg/dL (ref 8.7–10.2)
Chloride: 104 mmol/L (ref 96–106)
Creatinine, Ser: 1.15 mg/dL (ref 0.76–1.27)
GFR calc Af Amer: 84 mL/min/{1.73_m2} (ref >59)
GFR calc non Af Amer: 73 mL/min/{1.73_m2} (ref >59)
Globulin, Total: 2.6 g/dL (ref 1.5–4.5)
Glucose: 162 mg/dL — ABNORMAL HIGH (ref 65–99)
Potassium: 4.3 mmol/L (ref 3.5–5.2)
Sodium: 142 mmol/L (ref 134–144)
Total Protein: 6.9 g/dL (ref 6.0–8.5)

## 2020-06-04 LAB — HEMOGLOBIN A1C
Est. average glucose Bld gHb Est-mCnc: 186 mg/dL
Hgb A1c MFr Bld: 8.1 % — ABNORMAL HIGH (ref 4.8–5.6)

## 2020-06-04 NOTE — Telephone Encounter (Signed)
Please let him know that his A1C came back in diabetic range, so in addition to diet and exercise changes I'd like him to start metformin to help improve blood sugars. This should be rechecked in 3 months.   His cholesterol and liver enzymes were also elevated - to reduce stroke and heart attack risk I recommend starting a cholesterol medication (again in addition to diet and exercise) and he should avoid liver irritants such as tylenol and alcohol and we will recheck all of this.   Please get him scheduled for 3 month follow up and let me know if he would like to start these two medications

## 2020-06-04 NOTE — Telephone Encounter (Signed)
Copied from CRM [id number]. Topic: General - Call Back - No Documentation >> Jun 04, 2020  3:00 PM Reuben Likes D wrote: Reason for CRM: Patient would like to be contacted regarding recent lab results.

## 2020-06-04 NOTE — Telephone Encounter (Signed)
Labs not reviewed yet. Routing to provider for response once reviewed.

## 2020-06-05 ENCOUNTER — Telehealth: Payer: Self-pay | Admitting: Family Medicine

## 2020-06-05 NOTE — Telephone Encounter (Signed)
See mychart message. Patient was notified of results.

## 2020-06-05 NOTE — Telephone Encounter (Signed)
Patient is calling for advise regarding his medication for his cholesterol - Patient states that he has been communicating with provider on MyChart. Please advise CB- [phone]

## 2020-06-06 ENCOUNTER — Other Ambulatory Visit: Payer: Self-pay | Admitting: Family Medicine

## 2020-06-06 ENCOUNTER — Encounter: Payer: Self-pay | Admitting: Family Medicine

## 2020-06-06 DIAGNOSIS — E1165 Type 2 diabetes mellitus with hyperglycemia: Secondary | ICD-10-CM | POA: Insufficient documentation

## 2020-06-06 MED ORDER — EZETIMIBE 10 MG PO TABS: 10.0000 mg | ORAL_TABLET | Freq: Every day | ORAL | 0 refills | Status: DC

## 2020-06-06 MED ORDER — METFORMIN HCL 500 MG PO TABS: 500.0000 mg | ORAL_TABLET | Freq: Two times a day (BID) | ORAL | 0 refills | Status: DC

## 2020-06-06 NOTE — Telephone Encounter (Signed)
See my chart message

## 2020-08-08 ENCOUNTER — Emergency Department
Admission: EM | Admit: 2020-08-08 | Discharge: 2020-08-08 | Discharge status: Against Medical Advice | Payer: 59 | Attending: Emergency Medicine | Admitting: Emergency Medicine

## 2020-08-08 ENCOUNTER — Other Ambulatory Visit: Payer: Self-pay

## 2020-08-08 ENCOUNTER — Encounter: Payer: Self-pay | Admitting: Emergency Medicine

## 2020-08-08 NOTE — ED Notes (Signed)
Patient called x2 for room with no answer.

## 2020-08-08 NOTE — ED Notes (Signed)
Called x 3 for rooming with no answer.

## 2020-08-08 NOTE — ED Triage Notes (Signed)
Pt comes into the ED via POV c/o laceration to the right index finger with a saw.  Laceration has jagged edges on the pad of the finger.  Pt in NAD at this time, bleeding is under control, and finger wrapped in triage.

## 2020-09-02 ENCOUNTER — Other Ambulatory Visit: Payer: Self-pay

## 2020-09-02 ENCOUNTER — Ambulatory Visit (INDEPENDENT_AMBULATORY_CARE_PROVIDER_SITE_OTHER): Payer: 59 | Admitting: Family Medicine

## 2020-09-02 ENCOUNTER — Encounter: Payer: Self-pay | Admitting: Family Medicine

## 2020-09-02 VITALS — BP 117/81 | HR 74 | Temp 98.3°F | Wt 179.0 lb

## 2020-09-02 DIAGNOSIS — K760 Fatty (change of) liver, not elsewhere classified: Secondary | ICD-10-CM | POA: Diagnosis not present

## 2020-09-02 DIAGNOSIS — K219 Gastro-esophageal reflux disease without esophagitis: Secondary | ICD-10-CM

## 2020-09-02 DIAGNOSIS — F419 Anxiety disorder, unspecified: Secondary | ICD-10-CM

## 2020-09-02 DIAGNOSIS — E039 Hypothyroidism, unspecified: Secondary | ICD-10-CM | POA: Diagnosis not present

## 2020-09-02 DIAGNOSIS — E291 Testicular hypofunction: Secondary | ICD-10-CM

## 2020-09-02 DIAGNOSIS — E1165 Type 2 diabetes mellitus with hyperglycemia: Secondary | ICD-10-CM | POA: Diagnosis not present

## 2020-09-02 DIAGNOSIS — F339 Major depressive disorder, recurrent, unspecified: Secondary | ICD-10-CM

## 2020-09-02 DIAGNOSIS — E7800 Pure hypercholesterolemia, unspecified: Secondary | ICD-10-CM

## 2020-09-02 DIAGNOSIS — R7989 Other specified abnormal findings of blood chemistry: Secondary | ICD-10-CM

## 2020-09-02 LAB — URINALYSIS, ROUTINE W REFLEX MICROSCOPIC
Bilirubin, UA: NEGATIVE
Glucose, UA: NEGATIVE
Leukocytes,UA: NEGATIVE
Nitrite, UA: NEGATIVE
Protein,UA: NEGATIVE
RBC, UA: NEGATIVE
Specific Gravity, UA: 1.02 (ref 1.005–1.030)
Urobilinogen, Ur: 0.2 mg/dL (ref 0.2–1.0)
pH, UA: 5 (ref 5.0–7.5)

## 2020-09-02 LAB — MICROALBUMIN, URINE WAIVED
Creatinine, Urine Waived: 200 mg/dL (ref 10–300)
Microalb, Ur Waived: 80 mg/L — ABNORMAL HIGH (ref 0–19)
Microalb/Creat Ratio: <30 mg/g (ref <30)

## 2020-09-02 LAB — BAYER DCA HB A1C WAIVED: HB A1C (BAYER DCA - WAIVED): 5 % (ref <7.0)

## 2020-09-02 MED ORDER — EZETIMIBE 10 MG PO TABS
10.0000 mg | ORAL_TABLET | Freq: Every day | ORAL | 1 refills | Status: DC
Start: 2020-09-02 — End: 2020-12-05

## 2020-09-02 MED ORDER — FLUOXETINE HCL 20 MG PO TABS
20.0000 mg | ORAL_TABLET | Freq: Every day | ORAL | 1 refills | Status: DC
Start: 2020-09-02 — End: 2020-12-04

## 2020-09-02 MED ORDER — QUETIAPINE FUMARATE 25 MG PO TABS
25.0000 mg | ORAL_TABLET | Freq: Every evening | ORAL | 1 refills | Status: DC | PRN
Start: 2020-09-02 — End: 2020-09-09

## 2020-09-02 NOTE — Assessment & Plan Note (Signed)
Under good control on current regimen. Continue current regimen. Continue to monitor. Call with any concerns. Refills given. Labs drawn today.   

## 2020-09-02 NOTE — Assessment & Plan Note (Signed)
Doing fantastic with a1c of 5.0! Will stop metformin and recheck 3 months. Continue diet and exercise. Call with any concerns.

## 2020-09-02 NOTE — Assessment & Plan Note (Signed)
Doing well off medicine. Continue to monitor. Call with any concerns. Labs drawn today.  

## 2020-09-02 NOTE — Assessment & Plan Note (Signed)
Rechecking labs today. Await results. Treat as needed.  °

## 2020-09-02 NOTE — Assessment & Plan Note (Signed)
Checking labs today. Await results. Call with any concerns. Continue to monitor.  

## 2020-09-02 NOTE — Progress Notes (Signed)
BP 117/81   Pulse 74   Temp 98.3 F (36.8 C)   Wt 179 lb (81.2 kg)   SpO2 98%   BMI 30.25 kg/m    Subjective:    Patient ID: Michael Gillespie, male    DOB: 1966/07/02, 54 y.o.   MRN: [id number]  HPI: Michael Gillespie is a 54 y.o. male  Chief Complaint  Patient presents with  . Diabetes  . Hyperlipidemia  . Hypothyroidism   HYPERLIPIDEMIA Hyperlipidemia status: excellent compliance Satisfied with current treatment?  yes Side effects:  no Medication compliance: excellent compliance Past cholesterol meds: zetia Supplements: none Aspirin:  no The 10-year ASCVD risk score Denman George DC Jr., et al., 2013) is: 16.1%   Values used to calculate the score:     Age: 38 years     Sex: Male     Is Non-Hispanic African American: No     Diabetic: Yes     Tobacco smoker: No     Systolic Blood Pressure: 117 mmHg     Is BP treated: No     HDL Cholesterol: 29 mg/dL     Total Cholesterol: 249 mg/dL Chest pain:  no Coronary artery disease:  no  DIABETES Hypoglycemic episodes:no Polydipsia/polyuria: no Visual disturbance: no Chest pain: no Paresthesias: no Glucose Monitoring: no  Accucheck frequency: Not Checking Taking Insulin?: no Blood Pressure Monitoring: not checking Retinal Examination: Not up to Date Foot Exam: Not up to Date Diabetic Education: Not Completed Pneumovax: Not up to Date Influenza: Not up to Date Aspirin: no  HYPOTHYROIDISM Thyroid control status:controlled Satisfied with current treatment? yes Medication side effects: no Medication compliance: excellent compliance Recent dose adjustment:no Fatigue: no Cold intolerance: no Heat intolerance: no Weight gain: no Weight loss: yes Constipation: no Diarrhea/loose stools: no Palpitations: no Lower extremity edema: no Anxiety/depressed mood: no  DEPRESSION Mood status: controlled Satisfied with current treatment?: yes Symptom severity: mild  Duration of current treatment : chronic Side effects:  no Medication compliance: excellent compliance Psychotherapy/counseling: no  Previous psychiatric medications: prozac, seroquel Depressed mood: no Anxious mood: no Anhedonia: no Significant weight loss or gain: yes Insomnia: no  Fatigue: no Feelings of worthlessness or guilt: no Impaired concentration/indecisiveness: no Suicidal ideations: no Hopelessness: no Crying spells: no Depression screen [facility] 2/9 09/02/2020 01/02/2020 01/18/2019 12/29/2017 11/08/2017  Decreased Interest 0 0 0 0 0  Down, Depressed, Hopeless 0 0 1 0 0  PHQ - 2 Score 0 0 1 0 0  Altered sleeping 0 3 - 0 2  Tired, decreased energy 0 2 - 1 1  Change in appetite 0 1 - - 1  Feeling bad or failure about yourself  0 0 - 0 0  Trouble concentrating 0 0 - 0 0  Moving slowly or fidgety/restless 0 0 - 0 0  Suicidal thoughts 0 0 - 0 0  PHQ-9 Score 0 6 - 1 4    Relevant past medical, surgical, family and social history reviewed and updated as indicated. Interim medical history since our last visit reviewed. Allergies and medications reviewed and updated.  Review of Systems  Constitutional: Negative.   Respiratory: Negative.   Cardiovascular: Negative.   Gastrointestinal: Negative.   Musculoskeletal: Negative.   Neurological: Negative.   Psychiatric/Behavioral: Negative.     Per HPI unless specifically indicated above     Objective:    BP 117/81   Pulse 74   Temp 98.3 F (36.8 C)   Wt 179 lb (81.2 kg)   SpO2 98%  BMI 30.25 kg/m   Wt Readings from Last 3 Encounters:  09/02/20 179 lb (81.2 kg)  01/02/20 215 lb (97.5 kg)  05/23/19 207 lb (93.9 kg)    Physical Exam Vitals and nursing note reviewed.  Constitutional:      General: He is not in acute distress.    Appearance: Normal appearance. He is not ill-appearing, toxic-appearing or diaphoretic.  HENT:     Head: Normocephalic and atraumatic.     Right Ear: External ear normal.     Left Ear: External ear normal.     Nose: Nose normal.      Mouth/Throat:     Mouth: Mucous membranes are moist.     Pharynx: Oropharynx is clear.  Eyes:     General: No scleral icterus.       Right eye: No discharge.        Left eye: No discharge.     Extraocular Movements: Extraocular movements intact.     Conjunctiva/sclera: Conjunctivae normal.     Pupils: Pupils are equal, round, and reactive to light.  Cardiovascular:     Rate and Rhythm: Normal rate and regular rhythm.     Pulses: Normal pulses.     Heart sounds: Normal heart sounds. No murmur heard.  No friction rub. No gallop.   Pulmonary:     Effort: Pulmonary effort is normal. No respiratory distress.     Breath sounds: Normal breath sounds. No stridor. No wheezing, rhonchi or rales.  Chest:     Chest wall: No tenderness.  Musculoskeletal:        General: Normal range of motion.     Cervical back: Normal range of motion and neck supple.  Skin:    General: Skin is warm and dry.     Capillary Refill: Capillary refill takes less than 2 seconds.     Coloration: Skin is not jaundiced or pale.     Findings: No bruising, erythema, lesion or rash.  Neurological:     General: No focal deficit present.     Mental Status: He is alert and oriented to person, place, and time. Mental status is at baseline.  Psychiatric:        Mood and Affect: Mood normal.        Behavior: Behavior normal.        Thought Content: Thought content normal.        Judgment: Judgment normal.     Results for orders placed or performed in visit on 06/03/20  HgB A1c  Result Value Ref Range   Hgb A1c MFr Bld 8.1 (H) 4.8 - 5.6 %   Est. average glucose Bld gHb Est-mCnc 186 mg/dL  Comprehensive metabolic panel  Result Value Ref Range   Glucose 162 (H) 65 - 99 mg/dL   BUN 15 6 - 24 mg/dL   Creatinine, Ser 1.61 0.76 - 1.27 mg/dL   GFR calc non Af Amer 73 >59 mL/min/1.73   GFR calc Af Amer 84 >59 mL/min/1.73   BUN/Creatinine Ratio 13 9 - 20   Sodium 142 134 - 144 mmol/L   Potassium 4.3 3.5 - 5.2 mmol/L    Chloride 104 96 - 106 mmol/L   CO2 23 20 - 29 mmol/L   Calcium 9.0 8.7 - 10.2 mg/dL   Total Protein 6.9 6.0 - 8.5 g/dL   Albumin 4.3 3.8 - 4.9 g/dL   Globulin, Total 2.6 1.5 - 4.5 g/dL   Albumin/Globulin Ratio 1.7 1.2 - 2.2   Bilirubin Total  0.4 0.0 - 1.2 mg/dL   Alkaline Phosphatase 77 48 - 121 IU/L   AST 79 (H) 0 - 40 IU/L   ALT 114 (H) 0 - 44 IU/L  Lipid Panel w/o Chol/HDL Ratio  Result Value Ref Range   Cholesterol, Total 249 (H) 100 - 199 mg/dL   Triglycerides 878 (H) 0 - 149 mg/dL   HDL 29 (L) >67 mg/dL   VLDL Cholesterol Cal 74 (H) 5 - 40 mg/dL   LDL Chol Calc (NIH) 672 (H) 0 - 99 mg/dL      Assessment & Plan:   Problem List Items Addressed This Visit      Digestive   GERD (gastroesophageal reflux disease) - Primary    Doing well off medicine. Continue to monitor. Call with any concerns. Labs drawn today.      Relevant Orders   CBC with Differential/Platelet   Comprehensive metabolic panel   Fatty liver disease, nonalcoholic    Checking labs today. Await results. Call with any concerns. Continue to monitor.       Relevant Orders   CBC with Differential/Platelet   Comprehensive metabolic panel     Endocrine   Hypothyroidism    Rechecking labs today. Await results. Treat as needed.       Relevant Orders   CBC with Differential/Platelet   Comprehensive metabolic panel   TSH   Hypogonadism in male    Rechecking labs today. Await results. Treat as needed.       Relevant Orders   CBC with Differential/Platelet   Comprehensive metabolic panel   PSA   Testosterone, free, total([facility])   Type 2 diabetes mellitus with hyperglycemia (HCC)    Doing fantastic with a1c of 5.0! Will stop metformin and recheck 3 months. Continue diet and exercise. Call with any concerns.       Relevant Orders   Bayer DCA Hb A1c Waived   CBC with Differential/Platelet   Comprehensive metabolic panel   Microalbumin, Urine Waived   Urinalysis, Routine w reflex  microscopic     Other   Hyperlipidemia    Under good control on current regimen. Continue current regimen. Continue to monitor. Call with any concerns. Refills given. Labs drawn today.       Relevant Medications   ezetimibe (ZETIA) 10 MG tablet   Other Relevant Orders   CBC with Differential/Platelet   Comprehensive metabolic panel   Lipid Panel w/o Chol/HDL Ratio   Anxiety    Under good control on current regimen. Continue current regimen. Continue to monitor. Call with any concerns. Refills given. Labs drawn today.       Relevant Medications   FLUoxetine (PROZAC) 20 MG tablet   Depression, recurrent (HCC)    Under good control on current regimen. Continue current regimen. Continue to monitor. Call with any concerns. Refills given. Labs drawn today.       Relevant Medications   FLUoxetine (PROZAC) 20 MG tablet   Other Relevant Orders   CBC with Differential/Platelet   Comprehensive metabolic panel   Elevated LFTs    Rechecking labs today. Await results. Treat as needed.           Follow up plan: Return in about 3 months (around 12/03/2020).

## 2020-09-03 LAB — TSH: TSH: 0.233 u[IU]/mL — ABNORMAL LOW (ref 0.450–4.500)

## 2020-09-03 LAB — COMPREHENSIVE METABOLIC PANEL
ALT: 24 IU/L (ref 0–44)
AST: 22 IU/L (ref 0–40)
Albumin/Globulin Ratio: 1.7 (ref 1.2–2.2)
Albumin: 4.5 g/dL (ref 3.8–4.9)
Alkaline Phosphatase: 68 IU/L (ref 44–121)
BUN/Creatinine Ratio: 17 (ref 9–20)
BUN: 18 mg/dL (ref 6–24)
Bilirubin Total: 0.4 mg/dL (ref 0.0–1.2)
CO2: 23 mmol/L (ref 20–29)
Calcium: 9.3 mg/dL (ref 8.7–10.2)
Chloride: 103 mmol/L (ref 96–106)
Creatinine, Ser: 1.06 mg/dL (ref 0.76–1.27)
GFR calc Af Amer: 92 mL/min/{1.73_m2} (ref >59)
GFR calc non Af Amer: 80 mL/min/{1.73_m2} (ref >59)
Globulin, Total: 2.7 g/dL (ref 1.5–4.5)
Glucose: 95 mg/dL (ref 65–99)
Potassium: 4.4 mmol/L (ref 3.5–5.2)
Sodium: 141 mmol/L (ref 134–144)
Total Protein: 7.2 g/dL (ref 6.0–8.5)

## 2020-09-03 LAB — LIPID PANEL W/O CHOL/HDL RATIO
Cholesterol, Total: 152 mg/dL (ref 100–199)
HDL: 36 mg/dL — ABNORMAL LOW (ref >39)
LDL Chol Calc (NIH): 91 mg/dL (ref 0–99)
Triglycerides: 143 mg/dL (ref 0–149)
VLDL Cholesterol Cal: 25 mg/dL (ref 5–40)

## 2020-09-03 LAB — CBC WITH DIFFERENTIAL/PLATELET
Basophils Absolute: 0 10*3/uL (ref 0.0–0.2)
Basos: 0 %
EOS (ABSOLUTE): 0.2 10*3/uL (ref 0.0–0.4)
Eos: 4 %
Hematocrit: 41.5 % (ref 37.5–51.0)
Hemoglobin: 14.3 g/dL (ref 13.0–17.7)
Immature Grans (Abs): 0 10*3/uL (ref 0.0–0.1)
Immature Granulocytes: 0 %
Lymphocytes Absolute: 1.6 10*3/uL (ref 0.7–3.1)
Lymphs: 26 %
MCH: 30.4 pg (ref 26.6–33.0)
MCHC: 34.5 g/dL (ref 31.5–35.7)
MCV: 88 fL (ref 79–97)
Monocytes Absolute: 0.5 10*3/uL (ref 0.1–0.9)
Monocytes: 8 %
Neutrophils Absolute: 3.6 10*3/uL (ref 1.4–7.0)
Neutrophils: 62 %
Platelets: 202 10*3/uL (ref 150–450)
RBC: 4.71 x10E6/uL (ref 4.14–5.80)
RDW: 12.3 % (ref 11.6–15.4)
WBC: 5.9 10*3/uL (ref 3.4–10.8)

## 2020-09-03 LAB — PSA: Prostate Specific Ag, Serum: 0.9 ng/mL (ref 0.0–4.0)

## 2020-09-03 LAB — TESTOSTERONE, FREE, TOTAL, SHBG
Sex Hormone Binding: 27.9 nmol/L (ref 19.3–76.4)
Testosterone, Free: 8.3 pg/mL (ref 7.2–24.0)
Testosterone: 335 ng/dL (ref 264–916)

## 2020-09-08 ENCOUNTER — Other Ambulatory Visit: Payer: Self-pay | Admitting: Family Medicine

## 2020-09-08 DIAGNOSIS — E039 Hypothyroidism, unspecified: Secondary | ICD-10-CM

## 2020-09-08 MED ORDER — LEVOTHYROXINE SODIUM 100 MCG PO TABS: 100.0000 ug | ORAL_TABLET | Freq: Every day | ORAL | 1 refills | Status: DC

## 2020-09-08 NOTE — Telephone Encounter (Signed)
Follow up previous messages

## 2020-09-09 ENCOUNTER — Other Ambulatory Visit: Payer: Self-pay | Admitting: Family Medicine

## 2020-11-03 ENCOUNTER — Encounter: Payer: Self-pay | Admitting: Family Medicine

## 2020-11-06 ENCOUNTER — Ambulatory Visit: Payer: 59 | Admitting: Family Medicine

## 2020-11-08 ENCOUNTER — Other Ambulatory Visit: Payer: Self-pay | Admitting: Family Medicine

## 2020-11-08 DIAGNOSIS — E039 Hypothyroidism, unspecified: Secondary | ICD-10-CM

## 2020-11-08 NOTE — Telephone Encounter (Signed)
Requested Prescriptions  Pending Prescriptions Disp Refills  . levothyroxine (SYNTHROID) 100 MCG tablet [Pharmacy Med Name: LEVOTHYROXINE 0.100MG  ( ) TAB] 11 tablet 0    Sig: TAKE 1 TABLET(100 MCG) BY MOUTH DAILY BEFORE BREAKFAST     Endocrinology:  Hypothyroid Agents Failed - 11/08/2020 10:12 AM      Failed - TSH needs to be rechecked within 3 months after an abnormal result. Refill until TSH is due.      Failed - TSH in normal range and within 360 days    TSH  Date Value Ref Range Status  09/02/2020 0.233 (L) 0.450 - 4.500 uIU/mL Final         Passed - Valid encounter within last 12 months    Recent Outpatient Visits          2 months ago Gastroesophageal reflux disease, unspecified whether esophagitis present   [facility] [place], [place], DO   10 months ago Pure hypercholesterolemia   [facility] [place], [place]   1 year ago Right elbow pain   [facility] Part[place], [place]   1 year ago PE (physical exam), annual   [facility] [place], Redge Gainer, MD   1 year ago Skin lesion   [facility] Part[place], [place]      Future Appointments            In 1 week Laural Benes, Oralia Rud, DO [employer], [facility]

## 2020-11-18 ENCOUNTER — Ambulatory Visit: Payer: 59 | Admitting: Family Medicine

## 2020-11-20 ENCOUNTER — Other Ambulatory Visit: Payer: Self-pay | Admitting: Family Medicine

## 2020-11-20 DIAGNOSIS — E039 Hypothyroidism, unspecified: Secondary | ICD-10-CM

## 2020-12-04 ENCOUNTER — Telehealth: Payer: Self-pay

## 2020-12-04 MED ORDER — FLUOXETINE HCL 20 MG PO CAPS: 20.0000 mg | ORAL_CAPSULE | Freq: Every day | ORAL | 0 refills | Status: DC

## 2020-12-04 NOTE — Telephone Encounter (Signed)
Pharmacy would like to know if Fluoxetine 20mg  tablets can be switched to capsules?

## 2020-12-04 NOTE — Telephone Encounter (Signed)
Rx for caps sent to his pharmacy

## 2020-12-05 ENCOUNTER — Other Ambulatory Visit: Payer: Self-pay

## 2020-12-05 ENCOUNTER — Encounter: Payer: Self-pay | Admitting: Family Medicine

## 2020-12-05 ENCOUNTER — Ambulatory Visit (INDEPENDENT_AMBULATORY_CARE_PROVIDER_SITE_OTHER): Payer: 59 | Admitting: Family Medicine

## 2020-12-05 VITALS — BP 106/74 | HR 79 | Temp 98.0°F | Wt 180.6 lb

## 2020-12-05 DIAGNOSIS — E1165 Type 2 diabetes mellitus with hyperglycemia: Secondary | ICD-10-CM | POA: Diagnosis not present

## 2020-12-05 DIAGNOSIS — E039 Hypothyroidism, unspecified: Secondary | ICD-10-CM | POA: Diagnosis not present

## 2020-12-05 LAB — BAYER DCA HB A1C WAIVED: HB A1C (BAYER DCA - WAIVED): 5.4 % (ref <7.0)

## 2020-12-05 MED ORDER — FLUOXETINE HCL 20 MG PO TABS
20.0000 mg | ORAL_TABLET | Freq: Every day | ORAL | 1 refills | Status: DC
Start: 2020-12-05 — End: 2021-06-17

## 2020-12-05 MED ORDER — EZETIMIBE 10 MG PO TABS
10.0000 mg | ORAL_TABLET | Freq: Every day | ORAL | 1 refills | Status: DC
Start: 2020-12-05 — End: 2021-06-17

## 2020-12-05 NOTE — Assessment & Plan Note (Signed)
Doing great with a1c of 5.4 off medicine. Will continue current regimen. Continue to monitor call with any concerns.

## 2020-12-05 NOTE — Assessment & Plan Note (Signed)
Rechecking labs today. Await results. Treat as needed.  °

## 2020-12-05 NOTE — Progress Notes (Signed)
BP 106/74   Pulse 79   Temp 98 F (36.7 C)   Wt 180 lb 9.6 oz (81.9 kg)   SpO2 97%   BMI 30.52 kg/m    Subjective:    Patient ID: Michael Gillespie, male    DOB: 01/08/1966, 55 y.o.   MRN: [id number]  HPI: Michael Gillespie is a 55 y.o. male  Chief Complaint  Patient presents with  . Diabetes  . Depression  . Anxiety  . Hypothyroidism   DIABETES Hypoglycemic episodes:no Polydipsia/polyuria: no Visual disturbance: no Chest pain: no Paresthesias: no Glucose Monitoring: no  Accucheck frequency: Not Checking Taking Insulin?: no Blood Pressure Monitoring: not checking Retinal Examination: Not up to Date Foot Exam: Up to Date Diabetic Education: Completed Pneumovax: Declined Influenza: Declined Aspirin: no  HYPOTHYROIDISM Thyroid control status:stable Satisfied with current treatment? yes Medication side effects: no Medication compliance: excellent compliance Etiology of hypothyroidism:  Recent dose adjustment:yes Fatigue: no Cold intolerance: no Heat intolerance: no Weight gain: no Weight loss: no Constipation: no Diarrhea/loose stools: no  Palpitations: no Lower extremity edema: no Anxiety/depressed mood: no   Relevant past medical, surgical, family and social history reviewed and updated as indicated. Interim medical history since our last visit reviewed. Allergies and medications reviewed and updated.  Review of Systems  Constitutional: Negative.   Respiratory: Negative.   Cardiovascular: Negative.   Gastrointestinal: Negative.   Musculoskeletal: Negative.   Psychiatric/Behavioral: Negative.     Per HPI unless specifically indicated above     Objective:    BP 106/74   Pulse 79   Temp 98 F (36.7 C)   Wt 180 lb 9.6 oz (81.9 kg)   SpO2 97%   BMI 30.52 kg/m   Wt Readings from Last 3 Encounters:  12/05/20 180 lb 9.6 oz (81.9 kg)  09/02/20 179 lb (81.2 kg)  01/02/20 215 lb (97.5 kg)    Physical Exam Vitals and nursing note reviewed.   Constitutional:      General: He is not in acute distress.    Appearance: Normal appearance. He is not ill-appearing, toxic-appearing or diaphoretic.  HENT:     Head: Normocephalic and atraumatic.     Right Ear: External ear normal.     Left Ear: External ear normal.     Nose: Nose normal.     Mouth/Throat:     Mouth: Mucous membranes are moist.     Pharynx: Oropharynx is clear.  Eyes:     General: No scleral icterus.       Right eye: No discharge.        Left eye: No discharge.     Extraocular Movements: Extraocular movements intact.     Conjunctiva/sclera: Conjunctivae normal.     Pupils: Pupils are equal, round, and reactive to light.  Cardiovascular:     Rate and Rhythm: Normal rate and regular rhythm.     Pulses: Normal pulses.     Heart sounds: Normal heart sounds. No murmur heard. No friction rub. No gallop.   Pulmonary:     Effort: Pulmonary effort is normal. No respiratory distress.     Breath sounds: Normal breath sounds. No stridor. No wheezing, rhonchi or rales.  Chest:     Chest wall: No tenderness.  Musculoskeletal:        General: Normal range of motion.     Cervical back: Normal range of motion and neck supple.  Skin:    General: Skin is warm and dry.     Capillary  Refill: Capillary refill takes less than 2 seconds.     Coloration: Skin is not jaundiced or pale.     Findings: No bruising, erythema, lesion or rash.  Neurological:     General: No focal deficit present.     Mental Status: He is alert and oriented to person, place, and time. Mental status is at baseline.  Psychiatric:        Mood and Affect: Mood normal.        Behavior: Behavior normal.        Thought Content: Thought content normal.        Judgment: Judgment normal.     Results for orders placed or performed in visit on 09/02/20  Bayer DCA Hb A1c Waived  Result Value Ref Range   HB A1C (BAYER DCA - WAIVED) 5.0 <7.0 %  CBC with Differential/Platelet  Result Value Ref Range   WBC 5.9  3.4 - 10.8 x10E3/uL   RBC 4.71 4.14 - 5.80 x10E6/uL   Hemoglobin 14.3 13.0 - 17.7 g/dL   Hematocrit 03.4 74.2 - 51.0 %   MCV 88 79 - 97 fL   MCH 30.4 26.6 - 33.0 pg   MCHC 34.5 31.5 - 35.7 g/dL   RDW 59.5 63.8 - 75.6 %   Platelets 202 150 - 450 x10E3/uL   Neutrophils 62 Not Estab. %   Lymphs 26 Not Estab. %   Monocytes 8 Not Estab. %   Eos 4 Not Estab. %   Basos 0 Not Estab. %   Neutrophils Absolute 3.6 1.4 - 7.0 x10E3/uL   Lymphocytes Absolute 1.6 0.7 - 3.1 x10E3/uL   Monocytes Absolute 0.5 0.1 - 0.9 x10E3/uL   EOS (ABSOLUTE) 0.2 0.0 - 0.4 x10E3/uL   Basophils Absolute 0.0 0.0 - 0.2 x10E3/uL   Immature Granulocytes 0 Not Estab. %   Immature Grans (Abs) 0.0 0.0 - 0.1 x10E3/uL  Comprehensive metabolic panel  Result Value Ref Range   Glucose 95 65 - 99 mg/dL   BUN 18 6 - 24 mg/dL   Creatinine, Ser 4.33 0.76 - 1.27 mg/dL   GFR calc non Af Amer 80 >59 mL/min/1.73   GFR calc Af Amer 92 >59 mL/min/1.73   BUN/Creatinine Ratio 17 9 - 20   Sodium 141 134 - 144 mmol/L   Potassium 4.4 3.5 - 5.2 mmol/L   Chloride 103 96 - 106 mmol/L   CO2 23 20 - 29 mmol/L   Calcium 9.3 8.7 - 10.2 mg/dL   Total Protein 7.2 6.0 - 8.5 g/dL   Albumin 4.5 3.8 - 4.9 g/dL   Globulin, Total 2.7 1.5 - 4.5 g/dL   Albumin/Globulin Ratio 1.7 1.2 - 2.2   Bilirubin Total 0.4 0.0 - 1.2 mg/dL   Alkaline Phosphatase 68 44 - 121 IU/L   AST 22 0 - 40 IU/L   ALT 24 0 - 44 IU/L  Lipid Panel w/o Chol/HDL Ratio  Result Value Ref Range   Cholesterol, Total 152 100 - 199 mg/dL   Triglycerides 295 0 - 149 mg/dL   HDL 36 (L) >18 mg/dL   VLDL Cholesterol Cal 25 5 - 40 mg/dL   LDL Chol Calc (NIH) 91 0 - 99 mg/dL  Microalbumin, Urine Waived  Result Value Ref Range   Microalb, Ur Waived 80 (H) 0 - 19 mg/L   Creatinine, Urine Waived 200 10 - 300 mg/dL   Microalb/Creat Ratio <30 <30 mg/g  PSA  Result Value Ref Range   Prostate Specific Ag, Serum 0.9  0.0 - 4.0 ng/mL  TSH  Result Value Ref Range   TSH 0.233 (L) 0.450 -  4.500 uIU/mL  Urinalysis, Routine w reflex microscopic  Result Value Ref Range   Specific Gravity, UA 1.020 1.005 - 1.030   pH, UA 5.0 5.0 - 7.5   Color, UA Yellow Yellow   Appearance Ur Clear Clear   Leukocytes,UA Negative Negative   Protein,UA Negative Negative/Trace   Glucose, UA Negative Negative   Ketones, UA Trace (A) Negative   RBC, UA Negative Negative   Bilirubin, UA Negative Negative   Urobilinogen, Ur 0.2 0.2 - 1.0 mg/dL   Nitrite, UA Negative Negative  Testosterone, free, total([facility])  Result Value Ref Range   Testosterone 335 264 - 916 ng/dL   Testosterone, Free 8.3 7.2 - 24.0 pg/mL   Sex Hormone Binding 27.9 19.3 - 76.4 nmol/L      Assessment & Plan:   Problem List Items Addressed This Visit      Endocrine   Hypothyroidism    Rechecking labs today. Await results. Treat as needed.       Type 2 diabetes mellitus with hyperglycemia (HCC) - Primary    Doing great with a1c of 5.4 off medicine. Will continue current regimen. Continue to monitor call with any concerns.       Relevant Orders   Bayer DCA Hb A1c Waived       Follow up plan: Return in about 6 months (around 06/04/2021).

## 2020-12-06 LAB — TSH: TSH: 0.526 u[IU]/mL (ref 0.450–4.500)

## 2020-12-08 ENCOUNTER — Other Ambulatory Visit: Payer: Self-pay | Admitting: Family Medicine

## 2020-12-08 ENCOUNTER — Encounter: Payer: Self-pay | Admitting: Family Medicine

## 2020-12-08 DIAGNOSIS — E039 Hypothyroidism, unspecified: Secondary | ICD-10-CM

## 2020-12-08 MED ORDER — LEVOTHYROXINE SODIUM 88 MCG PO TABS: 88.0000 ug | ORAL_TABLET | Freq: Every day | ORAL | 1 refills | Status: DC

## 2021-02-12 ENCOUNTER — Other Ambulatory Visit: Payer: Self-pay | Admitting: Family Medicine

## 2021-02-12 DIAGNOSIS — E039 Hypothyroidism, unspecified: Secondary | ICD-10-CM

## 2021-03-12 ENCOUNTER — Other Ambulatory Visit: Payer: Self-pay | Admitting: Family Medicine

## 2021-03-12 NOTE — Telephone Encounter (Signed)
Notes to clinic:  medication requested is not on current medication list  Review for refill    Requested Prescriptions  Pending Prescriptions Disp Refills   metFORMIN (GLUCOPHAGE) 500 MG tablet [Pharmacy Med Name: METFORMIN 500MG TABLETS] 180 tablet 0    Sig: TAKE 1 TABLET(500 MG) BY MOUTH TWICE DAILY WITH A MEAL      Endocrinology:  Diabetes - Biguanides Passed - 03/12/2021 11:37 AM      Passed - Cr in normal range and within 360 days    Creatinine, Ser  Date Value Ref Range Status  09/02/2020 1.06 0.76 - 1.27 mg/dL Final          Passed - HBA1C is between 0 and 7.9 and within 180 days    HB A1C (BAYER DCA - WAIVED)  Date Value Ref Range Status  12/05/2020 5.4 <7.0 % Final    Comment:                                          Diabetic Adult            <7.0                                       Healthy Adult        4.3 - 5.7                                                           (DCCT/NGSP) American Diabetes Association's Summary of Glycemic Recommendations for Adults with Diabetes: Hemoglobin A1c <7.0%. More stringent glycemic goals (A1c <6.0%) may further reduce complications at the cost of increased risk of hypoglycemia.           Passed - eGFR in normal range and within 360 days    GFR calc Af Amer  Date Value Ref Range Status  09/02/2020 92 >59 mL/min/1.73 Final    Comment:    **In accordance with recommendations from the NKF-ASN Task force,**   [facility] is in the process of updating its eGFR calculation to the   2021 CKD-EPI creatinine equation that estimates kidney function   without a race variable.    GFR calc non Af Amer  Date Value Ref Range Status  09/02/2020 80 >59 mL/min/1.73 Final          Passed - Valid encounter within last 6 months    Recent Outpatient Visits           3 months ago Type 2 diabetes mellitus with hyperglycemia, without long-term current use of insulin ([place])   [place], Megan P, DO   6 months ago  Gastroesophageal reflux disease, unspecified whether esophagitis present   [place], [place], DO   1 year ago Pure hypercholesterolemia   [place], [place], [place]   1 year ago Right elbow pain   [place], [place]   1 year ago PE (physical exam), annual   [facility] [place], Jeannette How, MD       Future Appointments  In 2 months Vigg, Avanti, MD [facility], [facility]

## 2021-03-12 NOTE — Telephone Encounter (Signed)
Pt had apt on 12/05/2020 next apt on 06/05/2021

## 2021-04-13 ENCOUNTER — Other Ambulatory Visit: Payer: Self-pay | Admitting: Family Medicine

## 2021-04-13 DIAGNOSIS — E039 Hypothyroidism, unspecified: Secondary | ICD-10-CM

## 2021-05-19 ENCOUNTER — Encounter: Payer: Self-pay | Admitting: Family Medicine

## 2021-05-19 NOTE — Telephone Encounter (Signed)
appt

## 2021-05-19 NOTE — Telephone Encounter (Signed)
Appointment scheduled.

## 2021-05-21 ENCOUNTER — Ambulatory Visit: Payer: 59 | Admitting: Family Medicine

## 2021-05-22 ENCOUNTER — Ambulatory Visit (INDEPENDENT_AMBULATORY_CARE_PROVIDER_SITE_OTHER): Payer: 59 | Admitting: Family Medicine

## 2021-05-22 ENCOUNTER — Other Ambulatory Visit: Payer: Self-pay

## 2021-05-22 ENCOUNTER — Encounter: Payer: Self-pay | Admitting: Family Medicine

## 2021-05-22 VITALS — BP 98/63 | HR 84 | Temp 97.8°F | Wt 198.4 lb

## 2021-05-22 DIAGNOSIS — R5382 Chronic fatigue, unspecified: Secondary | ICD-10-CM

## 2021-05-22 MED ORDER — TRAZODONE HCL 50 MG PO TABS: 25.0000 mg | ORAL_TABLET | Freq: Every evening | ORAL | 3 refills | Status: DC | PRN

## 2021-05-22 NOTE — Progress Notes (Signed)
BP 98/63   Pulse 84   Temp 97.8 F (36.6 C) (Oral)   Wt 198 lb 6.4 oz (90 kg)   SpO2 97%   BMI 33.53 kg/m    Subjective:    Patient ID: LESSLIE MCKEEHAN, male    DOB: 12-13-1965, 55 y.o.   MRN: [id number]  HPI: LEAVY HEATHERLY is a 55 y.o. male  Chief Complaint  Patient presents with   Fatigue    Pt states he has been feeling fatigued for the last month. States he has been having trouble staying asleep.    FATIGUE Duration:  about a month Severity: moderate  Onset: sudden Context when symptoms started:  unknown Symptoms improve with rest: unsure  Depressive symptoms: no Stress/anxiety: no Insomnia: yes hard to stay asleep Snoring: no Observed apnea by bed partner: no Daytime hypersomnolence:no Wakes feeling refreshed:  unsure History of sleep study: no Dysnea on exertion:  no Orthopnea/PND: no Chest pain: no Chronic cough: no Lower extremity edema: no Arthralgias:no Myalgias: no Weakness: no Rash: no   Relevant past medical, surgical, family and social history reviewed and updated as indicated. Interim medical history since our last visit reviewed. Allergies and medications reviewed and updated.  Review of Systems  Constitutional:  Positive for fatigue. Negative for activity change, appetite change, chills, diaphoresis, fever and unexpected weight change.  HENT: Negative.    Respiratory: Negative.    Cardiovascular: Negative.   Gastrointestinal: Negative.   Musculoskeletal: Negative.   Neurological: Negative.   Psychiatric/Behavioral:  Positive for sleep disturbance. Negative for agitation, behavioral problems, confusion, decreased concentration, dysphoric mood, hallucinations, self-injury and suicidal ideas. The patient is not nervous/anxious and is not hyperactive.    Per HPI unless specifically indicated above     Objective:    BP 98/63   Pulse 84   Temp 97.8 F (36.6 C) (Oral)   Wt 198 lb 6.4 oz (90 kg)   SpO2 97%   BMI 33.53 kg/m   Wt  Readings from Last 3 Encounters:  05/22/21 198 lb 6.4 oz (90 kg)  12/05/20 180 lb 9.6 oz (81.9 kg)  09/02/20 179 lb (81.2 kg)   Orthostatic VS for the past 24 hrs (Last 3 readings):  BP- Lying Pulse- Lying BP- Sitting Pulse- Sitting BP- Standing at 0 minutes Pulse- Standing at 0 minutes  05/22/21 1458 146/79 65 118/81 78 119/81 77    Physical Exam Vitals and nursing note reviewed.  Constitutional:      General: He is not in acute distress.    Appearance: Normal appearance. He is not ill-appearing, toxic-appearing or diaphoretic.  HENT:     Head: Normocephalic and atraumatic.     Right Ear: External ear normal.     Left Ear: External ear normal.     Nose: Nose normal.     Mouth/Throat:     Mouth: Mucous membranes are moist.     Pharynx: Oropharynx is clear.  Eyes:     General: No scleral icterus.       Right eye: No discharge.        Left eye: No discharge.     Extraocular Movements: Extraocular movements intact.     Conjunctiva/sclera: Conjunctivae normal.     Pupils: Pupils are equal, round, and reactive to light.  Cardiovascular:     Rate and Rhythm: Normal rate and regular rhythm.     Pulses: Normal pulses.     Heart sounds: Normal heart sounds. No murmur heard.   No friction  rub. No gallop.  Pulmonary:     Effort: Pulmonary effort is normal. No respiratory distress.     Breath sounds: Normal breath sounds. No stridor. No wheezing, rhonchi or rales.  Chest:     Chest wall: No tenderness.  Musculoskeletal:        General: Normal range of motion.     Cervical back: Normal range of motion and neck supple.  Skin:    General: Skin is warm and dry.     Capillary Refill: Capillary refill takes less than 2 seconds.     Coloration: Skin is not jaundiced or pale.     Findings: No bruising, erythema, lesion or rash.  Neurological:     General: No focal deficit present.     Mental Status: He is alert and oriented to person, place, and time. Mental status is at baseline.   Psychiatric:        Mood and Affect: Mood normal.        Behavior: Behavior normal.        Thought Content: Thought content normal.        Judgment: Judgment normal.    Results for orders placed or performed in visit on 12/05/20  Bayer DCA Hb A1c Waived  Result Value Ref Range   HB A1C (BAYER DCA - WAIVED) 5.4 <7.0 %  TSH  Result Value Ref Range   TSH 0.526 0.450 - 4.500 uIU/mL      Assessment & Plan:   Problem List Items Addressed This Visit       Other   Chronic fatigue - Primary    BP running low. Will check his labs. Encouraged drinking something with electolytes. Will treat insomnia. Recheck in 1 month. Await results.       Relevant Orders   TSH   CBC with Differential/Platelet   Comprehensive metabolic panel   VITAMIN D 25 Hydroxy (Vit-D Deficiency, Fractures)   Lyme Disease Serology w/Reflex   Rocky mtn spotted fvr abs pnl(IgG+IgM)   Ehrlichia Antibody Panel   Babesia microti Antibody Panel     Follow up plan: Return as scheduled.

## 2021-05-22 NOTE — Assessment & Plan Note (Signed)
BP running low. Will check his labs. Encouraged drinking something with electolytes. Will treat insomnia. Recheck in 1 month. Await results.

## 2021-05-23 ENCOUNTER — Other Ambulatory Visit: Payer: Self-pay | Admitting: Family Medicine

## 2021-05-23 DIAGNOSIS — E039 Hypothyroidism, unspecified: Secondary | ICD-10-CM

## 2021-05-23 MED ORDER — VITAMIN D (ERGOCALCIFEROL) 1.25 MG (50000 UNIT) PO CAPS: 50000.0000 [IU] | ORAL_CAPSULE | ORAL | 1 refills | Status: DC

## 2021-05-23 MED ORDER — LEVOTHYROXINE SODIUM 100 MCG PO TABS: 100.0000 ug | ORAL_TABLET | Freq: Every day | ORAL | 1 refills | Status: DC

## 2021-05-25 ENCOUNTER — Encounter: Payer: Self-pay | Admitting: Family Medicine

## 2021-06-04 LAB — CBC WITH DIFFERENTIAL/PLATELET
Basophils Absolute: 0 10*3/uL (ref 0.0–0.2)
Basos: 1 %
EOS (ABSOLUTE): 0.2 10*3/uL (ref 0.0–0.4)
Eos: 3 %
Hematocrit: 43.6 % (ref 37.5–51.0)
Hemoglobin: 14.7 g/dL (ref 13.0–17.7)
Immature Grans (Abs): 0 10*3/uL (ref 0.0–0.1)
Immature Granulocytes: 1 %
Lymphocytes Absolute: 1.6 10*3/uL (ref 0.7–3.1)
Lymphs: 26 %
MCH: 29.7 pg (ref 26.6–33.0)
MCHC: 33.7 g/dL (ref 31.5–35.7)
MCV: 88 fL (ref 79–97)
Monocytes Absolute: 0.6 10*3/uL (ref 0.1–0.9)
Monocytes: 10 %
Neutrophils Absolute: 3.7 10*3/uL (ref 1.4–7.0)
Neutrophils: 59 %
Platelets: 201 10*3/uL (ref 150–450)
RBC: 4.95 x10E6/uL (ref 4.14–5.80)
RDW: 13.3 % (ref 11.6–15.4)
WBC: 6.2 10*3/uL (ref 3.4–10.8)

## 2021-06-04 LAB — COMPREHENSIVE METABOLIC PANEL
ALT: 44 IU/L (ref 0–44)
AST: 30 IU/L (ref 0–40)
Albumin/Globulin Ratio: 2 (ref 1.2–2.2)
Albumin: 4.7 g/dL (ref 3.8–4.9)
Alkaline Phosphatase: 65 IU/L (ref 44–121)
BUN/Creatinine Ratio: 20 (ref 9–20)
BUN: 21 mg/dL (ref 6–24)
Bilirubin Total: 0.3 mg/dL (ref 0.0–1.2)
CO2: 25 mmol/L (ref 20–29)
Calcium: 9.6 mg/dL (ref 8.7–10.2)
Chloride: 103 mmol/L (ref 96–106)
Creatinine, Ser: 1.03 mg/dL (ref 0.76–1.27)
Globulin, Total: 2.4 g/dL (ref 1.5–4.5)
Glucose: 95 mg/dL (ref 65–99)
Potassium: 4.7 mmol/L (ref 3.5–5.2)
Sodium: 141 mmol/L (ref 134–144)
Total Protein: 7.1 g/dL (ref 6.0–8.5)
eGFR: 87 mL/min/{1.73_m2} (ref >59)

## 2021-06-04 LAB — TSH: TSH: 7.32 u[IU]/mL — ABNORMAL HIGH (ref 0.450–4.500)

## 2021-06-04 LAB — EHRLICHIA ANTIBODY PANEL
E. Chaffeensis (HME) IgM Titer: NEGATIVE
E.Chaffeensis (HME) IgG: NEGATIVE
HGE IgG Titer: NEGATIVE
HGE IgM Titer: NEGATIVE

## 2021-06-04 LAB — VITAMIN D 25 HYDROXY (VIT D DEFICIENCY, FRACTURES): Vit D, 25-Hydroxy: 21.5 ng/mL — ABNORMAL LOW (ref 30.0–100.0)

## 2021-06-04 LAB — ROCKY MTN SPOTTED FVR ABS PNL(IGG+IGM)
RMSF IgG: NEGATIVE
RMSF IgM: 0.39 index (ref 0.00–0.89)

## 2021-06-04 LAB — BABESIA MICROTI ANTIBODY PANEL
Babesia microti IgG: <1:10 {titer}
Babesia microti IgM: <1:10 {titer}

## 2021-06-04 LAB — LYME DISEASE SEROLOGY W/REFLEX: Lyme Total Antibody EIA: NEGATIVE

## 2021-06-05 ENCOUNTER — Ambulatory Visit: Payer: 59 | Admitting: Internal Medicine

## 2021-06-17 ENCOUNTER — Ambulatory Visit (INDEPENDENT_AMBULATORY_CARE_PROVIDER_SITE_OTHER): Payer: 59 | Admitting: Family Medicine

## 2021-06-17 ENCOUNTER — Other Ambulatory Visit: Payer: Self-pay

## 2021-06-17 ENCOUNTER — Encounter: Payer: Self-pay | Admitting: Family Medicine

## 2021-06-17 VITALS — BP 114/80 | HR 83 | Temp 98.0°F | Ht 64.49 in | Wt 204.2 lb

## 2021-06-17 DIAGNOSIS — R7989 Other specified abnormal findings of blood chemistry: Secondary | ICD-10-CM

## 2021-06-17 DIAGNOSIS — E559 Vitamin D deficiency, unspecified: Secondary | ICD-10-CM

## 2021-06-17 DIAGNOSIS — E039 Hypothyroidism, unspecified: Secondary | ICD-10-CM

## 2021-06-17 DIAGNOSIS — E291 Testicular hypofunction: Secondary | ICD-10-CM | POA: Diagnosis not present

## 2021-06-17 DIAGNOSIS — E1165 Type 2 diabetes mellitus with hyperglycemia: Secondary | ICD-10-CM

## 2021-06-17 DIAGNOSIS — E7800 Pure hypercholesterolemia, unspecified: Secondary | ICD-10-CM | POA: Diagnosis not present

## 2021-06-17 DIAGNOSIS — F339 Major depressive disorder, recurrent, unspecified: Secondary | ICD-10-CM

## 2021-06-17 LAB — BAYER DCA HB A1C WAIVED: HB A1C (BAYER DCA - WAIVED): 5.6 % (ref 4.8–5.6)

## 2021-06-17 MED ORDER — FLUOXETINE HCL 20 MG PO TABS: 20.0000 mg | ORAL_TABLET | Freq: Every day | ORAL | 1 refills | Status: DC

## 2021-06-17 MED ORDER — EZETIMIBE 10 MG PO TABS: 10.0000 mg | ORAL_TABLET | Freq: Every day | ORAL | 1 refills | Status: DC

## 2021-06-17 NOTE — Progress Notes (Signed)
BP 114/80   Pulse 83   Temp 98 F (36.7 C) (Oral)   Ht 5' 4.49" (1.638 m)   Wt 204 lb 3.2 oz (92.6 kg)   SpO2 98%   BMI 34.52 kg/m    Subjective:    Patient ID: Michael Gillespie, male    DOB: [birth year], 55 y.o.   MRN: [id number]  HPI: Michael Gillespie is a 55 y.o. male  Chief Complaint  Patient presents with   Depression   Hyperlipidemia   HYPERLIPIDEMIA Hyperlipidemia status: excellent compliance Satisfied with current treatment?  no Side effects:  no Medication compliance: excellent compliance Past cholesterol meds: zetia Supplements: none Aspirin:  no The 10-year ASCVD risk score (Arnett DK, et al., 2019) is: 7.6%   Values used to calculate the score:     Age: 72 years     Sex: Male     Is Non-Hispanic African American: No     Diabetic: Yes     Tobacco smoker: No     Systolic Blood Pressure: 712 mmHg     Is BP treated: No     HDL Cholesterol: 47 mg/dL     Total Cholesterol: 199 mg/dL Chest pain:  no  HYPOTHYROIDISM Thyroid control status:unsure Satisfied with current treatment?  unsure Medication side effects: no Medication compliance: excellent compliance Etiology of hypothyroidism:  Recent dose adjustment:yes Fatigue: yes Cold intolerance: no Heat intolerance: no Weight gain: yes Weight loss: no Constipation: no Diarrhea/loose stools: no Palpitations: no Lower extremity edema: no Anxiety/depressed mood: yes  LOW TESTOSTERONE Duration: chronic Status: uncontrolled  Satisfied with current treatment:  no Previous testosterone therapies: androgel Medication side effects:  not on anything Decreased libido: yes Fatigue: yes Depressed mood: yes Muscle weakness: no Erectile dysfunction: yes  DEPRESSION Mood status: stable Satisfied with current treatment?: yes Symptom severity: mild  Duration of current treatment : chronic Side effects: no Medication compliance: excellent compliance Psychotherapy/counseling: no  Previous psychiatric  medications: prozac Depressed mood: yes Anxious mood: no Anhedonia: no Significant weight loss or gain: no Insomnia: no  Fatigue: yes Feelings of worthlessness or guilt: no Impaired concentration/indecisiveness: no Suicidal ideations: no Hopelessness: no Crying spells: no Depression screen [facility] 2/9 06/17/2021 12/05/2020 09/02/2020 01/02/2020 01/18/2019  Decreased Interest 0 0 0 0 0  Down, Depressed, Hopeless 0 0 0 0 1  PHQ - 2 Score 0 0 0 0 1  Altered sleeping 0 0 0 3 -  Tired, decreased energy 1 0 0 2 -  Change in appetite 0 0 0 1 -  Feeling bad or failure about yourself  0 0 0 0 -  Trouble concentrating 0 0 0 0 -  Moving slowly or fidgety/restless 0 0 0 0 -  Suicidal thoughts 0 0 0 0 -  PHQ-9 Score 1 0 0 6 -  Some recent data might be hidden    Relevant past medical, surgical, family and social history reviewed and updated as indicated. Interim medical history since our last visit reviewed. Allergies and medications reviewed and updated.  Review of Systems  Constitutional: Negative.   Respiratory: Negative.    Cardiovascular: Negative.   Gastrointestinal: Negative.   Musculoskeletal: Negative.   Neurological: Negative.   Psychiatric/Behavioral: Negative.     Per HPI unless specifically indicated above     Objective:    BP 114/80   Pulse 83   Temp 98 F (36.7 C) (Oral)   Ht 5' 4.49" (1.638 m)   Wt 204 lb 3.2 oz (92.6 kg)  SpO2 98%   BMI 34.52 kg/m   Wt Readings from Last 3 Encounters:  06/17/21 204 lb 3.2 oz (92.6 kg)  05/22/21 198 lb 6.4 oz (90 kg)  12/05/20 180 lb 9.6 oz (81.9 kg)    Physical Exam Vitals and nursing note reviewed.  Constitutional:      General: He is not in acute distress.    Appearance: Normal appearance. He is not ill-appearing, toxic-appearing or diaphoretic.  HENT:     Head: Normocephalic and atraumatic.     Right Ear: External ear normal.     Left Ear: External ear normal.     Nose: Nose normal.     Mouth/Throat:     Mouth:  Mucous membranes are moist.     Pharynx: Oropharynx is clear.  Eyes:     General: No scleral icterus.       Right eye: No discharge.        Left eye: No discharge.     Extraocular Movements: Extraocular movements intact.     Conjunctiva/sclera: Conjunctivae normal.     Pupils: Pupils are equal, round, and reactive to light.  Cardiovascular:     Rate and Rhythm: Normal rate and regular rhythm.     Pulses: Normal pulses.     Heart sounds: Normal heart sounds. No murmur heard.   No friction rub. No gallop.  Pulmonary:     Effort: Pulmonary effort is normal. No respiratory distress.     Breath sounds: Normal breath sounds. No stridor. No wheezing, rhonchi or rales.  Chest:     Chest wall: No tenderness.  Musculoskeletal:        General: Normal range of motion.     Cervical back: Normal range of motion and neck supple.  Skin:    General: Skin is warm and dry.     Capillary Refill: Capillary refill takes less than 2 seconds.     Coloration: Skin is not jaundiced or pale.     Findings: No bruising, erythema, lesion or rash.  Neurological:     General: No focal deficit present.     Mental Status: He is alert and oriented to person, place, and time. Mental status is at baseline.  Psychiatric:        Mood and Affect: Mood normal.        Behavior: Behavior normal.        Thought Content: Thought content normal.        Judgment: Judgment normal.    Results for orders placed or performed in visit on 06/17/21  CBC with Differential/Platelet  Result Value Ref Range   WBC 6.3 3.4 - 10.8 x10E3/uL   RBC 4.89 4.14 - 5.80 x10E6/uL   Hemoglobin 14.6 13.0 - 17.7 g/dL   Hematocrit 43.7 37.5 - 51.0 %   MCV 89 79 - 97 fL   MCH 29.9 26.6 - 33.0 pg   MCHC 33.4 31.5 - 35.7 g/dL   RDW 13.2 11.6 - 15.4 %   Platelets 205 150 - 450 x10E3/uL   Neutrophils 52 Not Estab. %   Lymphs 31 Not Estab. %   Monocytes 10 Not Estab. %   Eos 5 Not Estab. %   Basos 1 Not Estab. %   Neutrophils Absolute 3.3  1.4 - 7.0 x10E3/uL   Lymphocytes Absolute 1.9 0.7 - 3.1 x10E3/uL   Monocytes Absolute 0.6 0.1 - 0.9 x10E3/uL   EOS (ABSOLUTE) 0.3 0.0 - 0.4 x10E3/uL   Basophils Absolute 0.1 0.0 - 0.2 x10E3/uL  Immature Granulocytes 1 Not Estab. %   Immature Grans (Abs) 0.1 0.0 - 0.1 x10E3/uL  Bayer DCA Hb A1c Waived  Result Value Ref Range   HB A1C (BAYER DCA - WAIVED) 5.6 4.8 - 5.6 %  Comprehensive metabolic panel  Result Value Ref Range   Glucose 110 (H) 65 - 99 mg/dL   BUN 22 6 - 24 mg/dL   Creatinine, Ser 1.08 0.76 - 1.27 mg/dL   eGFR 82 >59 mL/min/1.73   BUN/Creatinine Ratio 20 9 - 20   Sodium 141 134 - 144 mmol/L   Potassium 4.4 3.5 - 5.2 mmol/L   Chloride 102 96 - 106 mmol/L   CO2 24 20 - 29 mmol/L   Calcium 9.2 8.7 - 10.2 mg/dL   Total Protein 7.1 6.0 - 8.5 g/dL   Albumin 4.7 3.8 - 4.9 g/dL   Globulin, Total 2.4 1.5 - 4.5 g/dL   Albumin/Globulin Ratio 2.0 1.2 - 2.2   Bilirubin Total 0.3 0.0 - 1.2 mg/dL   Alkaline Phosphatase 62 44 - 121 IU/L   AST 36 0 - 40 IU/L   ALT 60 (H) 0 - 44 IU/L  Lipid Panel w/o Chol/HDL Ratio  Result Value Ref Range   Cholesterol, Total 199 100 - 199 mg/dL   Triglycerides 222 (H) 0 - 149 mg/dL   HDL 47 >39 mg/dL   VLDL Cholesterol Cal 39 5 - 40 mg/dL   LDL Chol Calc (NIH) 113 (H) 0 - 99 mg/dL  TSH  Result Value Ref Range   TSH 3.940 0.450 - 4.500 uIU/mL  VITAMIN D 25 Hydroxy (Vit-D Deficiency, Fractures)  Result Value Ref Range   Vit D, 25-Hydroxy 30.0 30.0 - 100.0 ng/mL  Testosterone, free, total([facility])  Result Value Ref Range   Testosterone 199 (L) 264 - 916 ng/dL   Testosterone, Free WILL FOLLOW    Sex Hormone Binding 12.7 (L) 19.3 - 76.4 nmol/L      Assessment & Plan:   Problem List Items Addressed This Visit       Endocrine   Hypothyroidism - Primary    Rechecking labs today. Await results. Treat as needed.       Relevant Orders   CBC with Differential/Platelet (Completed)   Comprehensive metabolic panel (Completed)    TSH (Completed)   Hypogonadism in male    Rechecking labs today. Await results. Treat as needed.       Relevant Orders   CBC with Differential/Platelet (Completed)   Comprehensive metabolic panel (Completed)   Testosterone, free, total([facility]) (Completed)   Type 2 diabetes mellitus with hyperglycemia ([place])    Doing well with A1c of 5.6. Continue to monitor. Call with any concerns.       Relevant Orders   CBC with Differential/Platelet (Completed)   Bayer DCA Hb A1c Waived (Completed)   Comprehensive metabolic panel (Completed)     Other   Hyperlipidemia    Under good control on current regimen. Continue current regimen. Continue to monitor. Call with any concerns. Refills given. Labs drawn today.       Relevant Medications   ezetimibe (ZETIA) 10 MG tablet   Other Relevant Orders   CBC with Differential/Platelet (Completed)   Comprehensive metabolic panel (Completed)   Lipid Panel w/o Chol/HDL Ratio (Completed)   Depression, recurrent (HCC)    Under good control on current regimen. Continue current regimen. Continue to monitor. Call with any concerns. Refills given. Labs drawn today.       Relevant Medications   FLUoxetine (  PROZAC) 20 MG tablet   Other Relevant Orders   CBC with Differential/Platelet (Completed)   Comprehensive metabolic panel (Completed)   Elevated LFTs    Rechecking labs today. Await results. Treat as needed.        Relevant Orders   CBC with Differential/Platelet (Completed)   Comprehensive metabolic panel (Completed)   Other Visit Diagnoses     Vitamin D deficiency       Rechecking labs today. Await results. Treat as needed.    Relevant Orders   CBC with Differential/Platelet (Completed)   Comprehensive metabolic panel (Completed)   VITAMIN D 25 Hydroxy (Vit-D Deficiency, Fractures) (Completed)        Follow up plan: Return in about 6 months (around 12/15/2021) for physical.

## 2021-06-18 ENCOUNTER — Encounter: Payer: Self-pay | Admitting: Family Medicine

## 2021-06-19 ENCOUNTER — Other Ambulatory Visit: Payer: Self-pay | Admitting: Family Medicine

## 2021-06-19 DIAGNOSIS — E291 Testicular hypofunction: Secondary | ICD-10-CM

## 2021-06-19 NOTE — Assessment & Plan Note (Signed)
Rechecking labs today. Await results. Treat as needed.  °

## 2021-06-19 NOTE — Assessment & Plan Note (Signed)
Doing well with A1c of 5.6. Continue to monitor. Call with any concerns.

## 2021-06-19 NOTE — Assessment & Plan Note (Signed)
Under good control on current regimen. Continue current regimen. Continue to monitor. Call with any concerns. Refills given. Labs drawn today.   

## 2021-06-20 LAB — COMPREHENSIVE METABOLIC PANEL
ALT: 60 IU/L — ABNORMAL HIGH (ref 0–44)
AST: 36 IU/L (ref 0–40)
Albumin/Globulin Ratio: 2 (ref 1.2–2.2)
Albumin: 4.7 g/dL (ref 3.8–4.9)
Alkaline Phosphatase: 62 IU/L (ref 44–121)
BUN/Creatinine Ratio: 20 (ref 9–20)
BUN: 22 mg/dL (ref 6–24)
Bilirubin Total: 0.3 mg/dL (ref 0.0–1.2)
CO2: 24 mmol/L (ref 20–29)
Calcium: 9.2 mg/dL (ref 8.7–10.2)
Chloride: 102 mmol/L (ref 96–106)
Creatinine, Ser: 1.08 mg/dL (ref 0.76–1.27)
Globulin, Total: 2.4 g/dL (ref 1.5–4.5)
Glucose: 110 mg/dL — ABNORMAL HIGH (ref 65–99)
Potassium: 4.4 mmol/L (ref 3.5–5.2)
Sodium: 141 mmol/L (ref 134–144)
Total Protein: 7.1 g/dL (ref 6.0–8.5)
eGFR: 82 mL/min/{1.73_m2} (ref >59)

## 2021-06-20 LAB — CBC WITH DIFFERENTIAL/PLATELET
Basophils Absolute: 0.1 10*3/uL (ref 0.0–0.2)
Basos: 1 %
EOS (ABSOLUTE): 0.3 10*3/uL (ref 0.0–0.4)
Eos: 5 %
Hematocrit: 43.7 % (ref 37.5–51.0)
Hemoglobin: 14.6 g/dL (ref 13.0–17.7)
Immature Grans (Abs): 0.1 10*3/uL (ref 0.0–0.1)
Immature Granulocytes: 1 %
Lymphocytes Absolute: 1.9 10*3/uL (ref 0.7–3.1)
Lymphs: 31 %
MCH: 29.9 pg (ref 26.6–33.0)
MCHC: 33.4 g/dL (ref 31.5–35.7)
MCV: 89 fL (ref 79–97)
Monocytes Absolute: 0.6 10*3/uL (ref 0.1–0.9)
Monocytes: 10 %
Neutrophils Absolute: 3.3 10*3/uL (ref 1.4–7.0)
Neutrophils: 52 %
Platelets: 205 10*3/uL (ref 150–450)
RBC: 4.89 x10E6/uL (ref 4.14–5.80)
RDW: 13.2 % (ref 11.6–15.4)
WBC: 6.3 10*3/uL (ref 3.4–10.8)

## 2021-06-20 LAB — TESTOSTERONE, FREE, TOTAL, SHBG
Sex Hormone Binding: 12.7 nmol/L — ABNORMAL LOW (ref 19.3–76.4)
Testosterone, Free: 6.2 pg/mL — ABNORMAL LOW (ref 7.2–24.0)
Testosterone: 199 ng/dL — ABNORMAL LOW (ref 264–916)

## 2021-06-20 LAB — VITAMIN D 25 HYDROXY (VIT D DEFICIENCY, FRACTURES): Vit D, 25-Hydroxy: 30 ng/mL (ref 30.0–100.0)

## 2021-06-20 LAB — LIPID PANEL W/O CHOL/HDL RATIO
Cholesterol, Total: 199 mg/dL (ref 100–199)
HDL: 47 mg/dL (ref >39)
LDL Chol Calc (NIH): 113 mg/dL — ABNORMAL HIGH (ref 0–99)
Triglycerides: 222 mg/dL — ABNORMAL HIGH (ref 0–149)
VLDL Cholesterol Cal: 39 mg/dL (ref 5–40)

## 2021-06-20 LAB — TSH: TSH: 3.94 u[IU]/mL (ref 0.450–4.500)

## 2021-07-17 ENCOUNTER — Other Ambulatory Visit: Payer: Self-pay

## 2021-07-17 ENCOUNTER — Other Ambulatory Visit: Payer: 59

## 2021-07-17 DIAGNOSIS — E039 Hypothyroidism, unspecified: Secondary | ICD-10-CM

## 2021-07-17 DIAGNOSIS — E291 Testicular hypofunction: Secondary | ICD-10-CM

## 2021-07-21 ENCOUNTER — Encounter: Payer: Self-pay | Admitting: Family Medicine

## 2021-07-22 ENCOUNTER — Other Ambulatory Visit: Payer: Self-pay | Admitting: Family Medicine

## 2021-07-22 DIAGNOSIS — E291 Testicular hypofunction: Secondary | ICD-10-CM

## 2021-07-22 DIAGNOSIS — E039 Hypothyroidism, unspecified: Secondary | ICD-10-CM

## 2021-07-22 LAB — TESTOSTERONE, FREE, TOTAL, SHBG
Sex Hormone Binding: 12 nmol/L — ABNORMAL LOW (ref 19.3–76.4)
Testosterone, Free: 4.4 pg/mL — ABNORMAL LOW (ref 7.2–24.0)
Testosterone: 166 ng/dL — ABNORMAL LOW (ref 264–916)

## 2021-07-22 LAB — TSH: TSH: 4.02 u[IU]/mL (ref 0.450–4.500)

## 2021-07-22 MED ORDER — TESTOSTERONE 50 MG/5GM (1%) TD GEL: 5.0000 g | Freq: Every day | TRANSDERMAL | 2 refills | Status: DC

## 2021-07-23 NOTE — Telephone Encounter (Signed)
Requested Prescriptions  Pending Prescriptions Disp Refills  . levothyroxine (SYNTHROID) 100 MCG tablet [Pharmacy Med Name: LEVOTHYROXINE 0.100MG  ( ) TAB] 90 tablet 3    Sig: TAKE 1 TABLET(100 MCG) BY MOUTH DAILY BEFORE AND BREAKFAST     Endocrinology:  Hypothyroid Agents Failed - 07/22/2021  3:40 PM      Failed - TSH needs to be rechecked within 3 months after an abnormal result. Refill until TSH is due.      Passed - TSH in normal range and within 360 days    TSH  Date Value Ref Range Status  07/17/2021 4.020 0.450 - 4.500 uIU/mL Final         Passed - Valid encounter within last 12 months    Recent Outpatient Visits          1 month ago Hypothyroidism, unspecified type   [facility] [place], Megan P, DO   2 months ago Chronic fatigue   [facility] [place], Megan P, DO   7 months ago Type 2 diabetes mellitus with hyperglycemia, without long-term current use of insulin (HCC)   [facility] [place], Megan P, DO   10 months ago Gastroesophageal reflux disease, unspecified whether esophagitis present   [facility] [place], Megan P, DO   1 year ago Pure hypercholesterolemia   [facility] [place], [place]      Future Appointments            In 4 months [place], Oralia Rud, DO [employer], [facility]

## 2021-07-24 ENCOUNTER — Other Ambulatory Visit: Payer: Self-pay | Admitting: Family Medicine

## 2021-07-24 DIAGNOSIS — E291 Testicular hypofunction: Secondary | ICD-10-CM

## 2021-08-03 ENCOUNTER — Encounter: Payer: Self-pay | Admitting: Family Medicine

## 2021-08-07 ENCOUNTER — Telehealth: Payer: Self-pay | Admitting: Pharmacist

## 2021-08-07 NOTE — Telephone Encounter (Signed)
    Care Management   Outreach Note  08/07/2021 Name: Michael Gillespie MRN: [id number] DOB: Feb 06, 1966  Referred by: Dorcas Carrow, DO Reason for referral : No chief complaint on file.  CM Pharmacist covering at [facility] today. Referral placed to Care Management team by PCP on 10/14 for assistance with cost of patient's testosterone gel.   Outreach to patient today by telephone. Reports cost of current Androgel Rx is unaffordable to him.   Note manufacturer of Androgel, [employer], no longer accepting new patients into Androgel patient assistance program.  Patient states that he is able to login to view his plan benefits on [employer]. Encourage patient to review the drug formulary for his plan from this portal in order to determine what are the lowest tier options covered under his plan. Discuss with patient that these options may still require a prior authorization to be completed, as would be indicated with a "PA" next to the listing on the formulary.  Offer patient contact information for CM Pharmacist so that he can reach back out once he has reviewed the formulary and navigate medication assistance options further, but patient prefers to reach back out to PCP with this information.  Counsel patient on option of alternatively using GoodRx for possible cost savings, if unable to find affordable solution through health plan coverage  Will collaborate with PCP and CM Nurse Case Manager.  Follow Up Plan: Patient declines further follow up from CM Pharmacist at this time.  Estelle Grumbles, PharmD, [facility] Clinical Pharmacist [facility] Care Management [phone]

## 2021-08-08 MED ORDER — METFORMIN HCL 500 MG PO TABS: 500.0000 mg | ORAL_TABLET | Freq: Two times a day (BID) | ORAL | 0 refills | Status: DC

## 2021-08-21 ENCOUNTER — Telehealth: Payer: 59 | Admitting: General Practice

## 2021-08-21 ENCOUNTER — Ambulatory Visit: Payer: Self-pay

## 2021-08-21 DIAGNOSIS — E7800 Pure hypercholesterolemia, unspecified: Secondary | ICD-10-CM

## 2021-08-21 DIAGNOSIS — F339 Major depressive disorder, recurrent, unspecified: Secondary | ICD-10-CM

## 2021-08-21 DIAGNOSIS — E039 Hypothyroidism, unspecified: Secondary | ICD-10-CM

## 2021-08-21 DIAGNOSIS — E1165 Type 2 diabetes mellitus with hyperglycemia: Secondary | ICD-10-CM

## 2021-08-21 DIAGNOSIS — E291 Testicular hypofunction: Secondary | ICD-10-CM

## 2021-08-21 DIAGNOSIS — F419 Anxiety disorder, unspecified: Secondary | ICD-10-CM

## 2021-08-21 NOTE — Patient Instructions (Signed)
Visit Information  PATIENT GOALS/PLAN OF CARE:  Care Plan : RNCM: General Plan of Care (Adult) for Chronic Disease Management and Care Coordination Needs  Updates made by Marlowe Sax, RN since 08/21/2021 12:00 AM     Problem: RNCM: Devlopment of plan of care for Chronic Disease Management (Hypothyroidism, DM2, HTN, Hypogonadism in Male, Anxiety and Depression)   Priority: High     Long-Range Goal: RNCM: Devlopment of plan of care for Chronic Disease Management (Hypothyroidism, DM2, HTN, Hypogonadism in Male, Anxiety and Depression)   Start Date: 08/21/2021  Expected End Date: 08/21/2022  Priority: High  Note:   Current Barriers:  Knowledge Deficits related to plan of care for management of HLD, DMII, Anxiety with Excessive Worry, and Depression: depressed mood anxiety, and hypothyroidism and hypogonadism   Care Coordination needs related to Financial constraints related to inability to afford testosterone medications and needs alternate medication options  and Medication procurement  Chronic Disease Management support and education needs related to HLD, DMII, Anxiety with Excessive Worry, and Depression: depressed mood anxiety, and hypothyroidism and hypogonadism Financial Constraints.   RNCM Clinical Goal(s):  Patient will verbalize understanding of plan for management of HLD, DMII, Anxiety, Depression, Hypothyroidism, and Hypogonadism as evidenced by working with the CCM team, and pcp to effectively manage chronic conditions take all medications exactly as prescribed and will call provider for medication related questions as evidenced by taking medications as prescribed, and working with the pcp and RNCM for assistance with testosterone treatment options     demonstrate improved and ongoing adherence to prescribed treatment plan for HLD, DMII, Anxiety, Depression, Hypothyroidism, and Hypogonadism  as evidenced by keeping appointments, taking medications as directed, following the plan  of care, and adherence to heart health/ADA diet demonstrate a decrease in HLD, DMII, Anxiety, Depression, Hypothyroidism, and hypogonadism  exacerbations  as evidenced by working with the CCM team to effectively manage chronic conditions and optimize health and well being demonstrate ongoing self health care management ability by effective management of chronic condtions  as evidenced by working with the CCM team  through collaboration with Medical illustrator, provider, and care team.   Interventions: 1:1 collaboration with primary care provider regarding development and update of comprehensive plan of care as evidenced by provider attestation and co-signature Inter-disciplinary care team collaboration (see longitudinal plan of care) Evaluation of current treatment plan related to  self management and patient's adherence to plan as established by provider   SDOH Barriers (Status: Goal on Track (progressing): YES.) Long Term Goal  Patient interviewed and SDOH assessment performed        SDOH Interventions    Flowsheet Row Most Recent Value  SDOH Interventions   Food Insecurity Interventions Intervention Not Indicated  Financial Strain Interventions Intervention Not Indicated  Housing Interventions Intervention Not Indicated  Intimate Partner Violence Interventions Intervention Not Indicated  Stress Interventions Intervention Not Indicated  Social Connections Interventions Intervention Not Indicated  Transportation Interventions Intervention Not Indicated     Patient interviewed and appropriate assessments performed Provided patient with information about resources available and care guides to assist with new concerns and changes in SDOH Discussed plans with patient for ongoing care management follow up and provided patient with direct contact information for care management team Advised patient to call the office for changes in SDOH, questions or concerns    Diabetes:  (Status: Goal on  Track (progressing): YES.) Long Term Goal   Lab Results  Component Value Date   HGBA1C 5.6 06/17/2021  Assessed patient's understanding of A1c goal: <7% Provided education to patient about basic DM disease process; Reviewed medications with patient and discussed importance of medication adherence. 08-21-2021: The patient has started taking the Metformin again, he started this back last week.  Denies any concerns or side effects with Metformin;        Reviewed prescribed diet with patient heart healthy/ADA diet ; Counseled on importance of regular laboratory monitoring as prescribed;        Discussed plans with patient for ongoing care management follow up and provided patient with direct contact information for care management team;      Provided patient with written educational materials related to hypo and hyperglycemia and importance of correct treatment;       Reviewed scheduled/upcoming provider appointments including: 12-15-2021;         Advised patient, providing education and rationale, to check cbg as directed  and record        call provider for findings outside established parameters;       Review of patient status, including review of consultants reports, relevant laboratory and other test results, and medications completed;       Advised patient to discuss blood sugar trends, changes in medications, new concerns and questions  with provider;        Depression and Anxiety   (Status: Goal on Track (progressing): YES.) Long Term Goal  Evaluation of current treatment plan related to Anxiety and Depression, Mental Health Concerns  self-management and patient's adherence to plan as established by provider. Discussed plans with patient for ongoing care management follow up and provided patient with direct contact information for care management team Advised patient to call the office for changes in mood, anxiety, or depression ; Provided education to patient re: doing things he enjoys,  being with positive people, working through periods of increased depression and anxiety; Reviewed medications with patient and discussed compliance ; Reviewed scheduled/upcoming provider appointments including 12-15-2021; Discussed plans with patient for ongoing care management follow up and provided patient with direct contact information for care management team;   Hypothyroidism   (Status: Goal on Track (progressing): YES.) Long Term Goal  Evaluation of current treatment plan related to Hypothyroidism,  self-management and patient's adherence to plan as established by provider. Discussed plans with patient for ongoing care management follow up and provided patient with direct contact information for care management team Advised patient to call the office for changes in condition, questions, or concerns; Reviewed medications with patient and discussed compliance ; Reviewed scheduled/upcoming provider appointments including 12-15-2021; Discussed plans with patient for ongoing care management follow up and provided patient with direct contact information for care management team;   Hypogonadism in [place]  (Status: Goal on track: NO.) Long Term Goal  Evaluation of current treatment plan related to  Hypogonadism ,  self-management and patient's adherence to plan as established by provider. Discussed plans with patient for ongoing care management follow up and provided patient with direct contact information for care management team Advised patient to call the office for changes in condition, questions or concerns; Provided education to patient re: other options for medication management of his hypogonadism. The patient states that he reviewed the formulary but there were no real substitutes for him on the formulary.; Reviewed medications with patient and discussed The patient cannot afford the Androgel RX prescribed by pcp. The patient is open to other options. The patient did talk to the [facility] pharmacist  last week. He is open to  recommendations by the pcp; Collaborated with pcp and pharm D regarding other options for medications mangement of hypogonadism ; Reviewed scheduled/upcoming provider appointments including 12-15-2021; Discussed plans with patient for ongoing care management follow up and provided patient with direct contact information for care management team; Advised patient to discuss other treatment options  with provider;  Hyperlipidemia:  (Status: Goal on Track (progressing): YES.) Long Term Goal  Lab Results  Component Value Date   CHOL 199 06/17/2021   HDL 47 06/17/2021   LDLCALC 113 (H) 06/17/2021   TRIG 222 (H) 06/17/2021   CHOLHDL 5.9 (H) 12/29/2017     Medication review performed; medication list updated in electronic medical record.  Provider established cholesterol goals reviewed; Counseled on importance of regular laboratory monitoring as prescribed; Provided HLD educational materials; Reviewed role and benefits of statin for ASCVD risk reduction; Discussed strategies to manage statin-induced myalgias; Reviewed importance of limiting foods high in cholesterol; Reviewed exercise goals and target of 150 minutes per week;  Patient Goals/Self-Care Activities: Patient will self administer medications as prescribed as evidenced by self report/primary caregiver report  Patient will attend all scheduled provider appointments as evidenced by clinician review of documented attendance to scheduled appointments and patient/caregiver report Patient will call pharmacy for medication refills as evidenced by patient report and review of pharmacy fill history as appropriate Patient will attend church or other social activities as evidenced by patient report Patient will continue to perform ADL's independently as evidenced by patient/caregiver report Patient will continue to perform IADL's independently as evidenced by patient/caregiver report Patient will call provider office for  new concerns or questions as evidenced by review of documented incoming telephone call notes and patient report Patient will work with BSW to address care coordination needs and will continue to work with the clinical team to address health care and disease management related needs as evidenced by documented adherence to scheduled care management/care coordination appointments schedule appointment with eye doctor check blood sugar at prescribed times: when you have symptoms of low or high blood sugar, before and after exercise, and as directed by the pcp   check feet daily for cuts, sores or redness trim toenails straight across drink 6 to 8 glasses of water each day eat fish at least once per week fill half of plate with vegetables limit fast food meals to no more than 1 per week manage portion size prepare main meal at home 3 to 5 days each week read food labels for fat, fiber, carbohydrates and portion size reduce red meat to 2 to 3 times a week do heel pump exercise 2 to 3 times each day keep feet up while sitting wash and dry feet carefully every day wear comfortable, cotton socks wear comfortable, well-fitting shoes - call for medicine refill 2 or 3 days before it runs out - take all medications exactly as prescribed - call doctor with any symptoms you believe are related to your medicine - call doctor when you experience any new symptoms - go to all doctor appointments as scheduled - adhere to prescribed diet: heart healthy/ADA diet        Mr. Negrette was given information about Care Management services by the embedded care coordination team including:  Care Management services include personalized support from designated clinical staff supervised by his physician, including individualized plan of care and coordination with other care providers 24/7 contact phone numbers for assistance for urgent and routine care needs. The patient may stop CCM services at any time (  effective  at the end of the month) by phone call to the office staff.  Patient agreed to services and verbal consent obtained.   Patient verbalizes understanding of instructions provided today and agrees to view in MyChart.   Telephone follow up appointment with care management team member scheduled for: 09-23-2021 at 1:45 pm  Alto Denver RN, MSN, CCM Community Care Coordinator [place]  [facility] Mobile: [phone]

## 2021-08-21 NOTE — Chronic Care Management (AMB) (Signed)
Care Management    RN Visit Note  08/21/2021 Name: Michael Gillespie MRN: [id number] DOB: [birth year]  Subjective: Michael Gillespie is a 55 y.o. year old male who is a primary care patient of Dorcas Carrow, DO. The care management team was consulted for assistance with disease management and care coordination needs.    Engaged with patient by telephone for initial visit in response to provider referral for case management and/or care coordination services.   Consent to Services:   Mr. Holtman was given information about Care Management services today including:  Care Management services includes personalized support from designated clinical staff supervised by his physician, including individualized plan of care and coordination with other care providers 24/7 contact phone numbers for assistance for urgent and routine care needs. The patient may stop case management services at any time by phone call to the office staff.  Patient agreed to services and consent obtained.   Assessment: Review of patient past medical history, allergies, medications, health status, including review of consultants reports, laboratory and other test data, was performed as part of comprehensive evaluation and provision of chronic care management services.   SDOH (Social Determinants of Health) assessments and interventions performed:  SDOH Interventions    Flowsheet Row Most Recent Value  SDOH Interventions   Food Insecurity Interventions Intervention Not Indicated  Financial Strain Interventions Intervention Not Indicated  Housing Interventions Intervention Not Indicated  Intimate Partner Violence Interventions Intervention Not Indicated  Stress Interventions Intervention Not Indicated  Social Connections Interventions Intervention Not Indicated  Transportation Interventions Intervention Not Indicated        Care Plan  No Known Allergies  Outpatient Encounter Medications as of 08/21/2021   Medication Sig   ezetimibe (ZETIA) 10 MG tablet Take 1 tablet (10 mg total) by mouth daily.   FLUoxetine (PROZAC) 20 MG tablet Take 1 tablet (20 mg total) by mouth daily.   levothyroxine (SYNTHROID) 100 MCG tablet TAKE 1 TABLET(100 MCG) BY MOUTH DAILY BEFORE AND BREAKFAST   metFORMIN (GLUCOPHAGE) 500 MG tablet Take 1 tablet (500 mg total) by mouth 2 (two) times daily with a meal.   testosterone (ANDROGEL) 50 MG/5GM (1%) GEL Place 5 g onto the skin daily.   traZODone (DESYREL) 50 MG tablet Take 0.5-1 tablets (25-50 mg total) by mouth at bedtime as needed for sleep.   Vitamin D, Ergocalciferol, (DRISDOL) 1.25 MG (50000 UNIT) CAPS capsule Take 1 capsule (50,000 Units total) by mouth every 7 (seven) days.   No facility-administered encounter medications on file as of 08/21/2021.    Patient Active Problem List   Diagnosis Date Noted   Chronic fatigue 05/22/2021   Type 2 diabetes mellitus with hyperglycemia (HCC) 06/06/2020   Insomnia 10/07/2017   Exophthalmos 03/03/2017   Fatty liver disease, nonalcoholic 10/20/2016   Hypogonadism in male 04/28/2016   Elevated LFTs 04/26/2016   Depression, recurrent (HCC) 02/25/2016   Hyperlipidemia    GERD (gastroesophageal reflux disease)    Hypothyroidism    History of nephrolithiasis    Anxiety     Conditions to be addressed/monitored: HLD, DMII, Anxiety, Depression, Hypogonadism in Males, and Hypothyroidism  Care Plan : RNCM: General Plan of Care (Adult) for Chronic Disease Management and Care Coordination Needs  Updates made by Marlowe Sax, RN since 08/21/2021 12:00 AM     Problem: RNCM: Devlopment of plan of care for Chronic Disease Management (Hypothyroidism, DM2, HTN, Hypogonadism in Male, Anxiety and Depression)   Priority: High     Long-Range  Goal: RNCM: Devlopment of plan of care for Chronic Disease Management (Hypothyroidism, DM2, HTN, Hypogonadism in Male, Anxiety and Depression)   Start Date: 08/21/2021  Expected End Date:  08/21/2022  Priority: High  Note:   Current Barriers:  Knowledge Deficits related to plan of care for management of HLD, DMII, Anxiety with Excessive Worry, and Depression: depressed mood anxiety, and hypothyroidism and hypogonadism   Care Coordination needs related to Financial constraints related to inability to afford testosterone medications and needs alternate medication options  and Medication procurement  Chronic Disease Management support and education needs related to HLD, DMII, Anxiety with Excessive Worry, and Depression: depressed mood anxiety, and hypothyroidism and hypogonadism Corporate treasurer.   RNCM Clinical Goal(s):  Patient will verbalize understanding of plan for management of HLD, DMII, Anxiety, Depression, Hypothyroidism, and Hypogonadism as evidenced by working with the CCM team, and pcp to effectively manage chronic conditions take all medications exactly as prescribed and will call provider for medication related questions as evidenced by taking medications as prescribed, and working with the pcp and RNCM for assistance with testosterone treatment options     demonstrate improved and ongoing adherence to prescribed treatment plan for HLD, DMII, Anxiety, Depression, Hypothyroidism, and Hypogonadism  as evidenced by keeping appointments, taking medications as directed, following the plan of care, and adherence to heart health/ADA diet demonstrate a decrease in HLD, DMII, Anxiety, Depression, Hypothyroidism, and hypogonadism  exacerbations  as evidenced by working with the CCM team to effectively manage chronic conditions and optimize health and well being demonstrate ongoing self health care management ability by effective management of chronic condtions  as evidenced by working with the CCM team  through collaboration with Medical illustrator, provider, and care team.   Interventions: 1:1 collaboration with primary care provider regarding development and update of  comprehensive plan of care as evidenced by provider attestation and co-signature Inter-disciplinary care team collaboration (see longitudinal plan of care) Evaluation of current treatment plan related to  self management and patient's adherence to plan as established by provider   SDOH Barriers (Status: Goal on Track (progressing): YES.) Long Term Goal  Patient interviewed and SDOH assessment performed        SDOH Interventions    Flowsheet Row Most Recent Value  SDOH Interventions   Food Insecurity Interventions Intervention Not Indicated  Financial Strain Interventions Intervention Not Indicated  Housing Interventions Intervention Not Indicated  Intimate Partner Violence Interventions Intervention Not Indicated  Stress Interventions Intervention Not Indicated  Social Connections Interventions Intervention Not Indicated  Transportation Interventions Intervention Not Indicated     Patient interviewed and appropriate assessments performed Provided patient with information about resources available and care guides to assist with new concerns and changes in SDOH Discussed plans with patient for ongoing care management follow up and provided patient with direct contact information for care management team Advised patient to call the office for changes in SDOH, questions or concerns    Diabetes:  (Status: Goal on Track (progressing): YES.) Long Term Goal   Lab Results  Component Value Date   HGBA1C 5.6 06/17/2021  Assessed patient's understanding of A1c goal: <7% Provided education to patient about basic DM disease process; Reviewed medications with patient and discussed importance of medication adherence. 08-21-2021: The patient has started taking the Metformin again, he started this back last week.  Denies any concerns or side effects with Metformin;        Reviewed prescribed diet with patient heart healthy/ADA diet ; Counseled on importance of  regular laboratory monitoring as  prescribed;        Discussed plans with patient for ongoing care management follow up and provided patient with direct contact information for care management team;      Provided patient with written educational materials related to hypo and hyperglycemia and importance of correct treatment;       Reviewed scheduled/upcoming provider appointments including: 12-15-2021;         Advised patient, providing education and rationale, to check cbg as directed  and record        call provider for findings outside established parameters;       Review of patient status, including review of consultants reports, relevant laboratory and other test results, and medications completed;       Advised patient to discuss blood sugar trends, changes in medications, new concerns and questions  with provider;        Depression and Anxiety   (Status: Goal on Track (progressing): YES.) Long Term Goal  Evaluation of current treatment plan related to Anxiety and Depression, Mental Health Concerns  self-management and patient's adherence to plan as established by provider. Discussed plans with patient for ongoing care management follow up and provided patient with direct contact information for care management team Advised patient to call the office for changes in mood, anxiety, or depression ; Provided education to patient re: doing things he enjoys, being with positive people, working through periods of increased depression and anxiety; Reviewed medications with patient and discussed compliance ; Reviewed scheduled/upcoming provider appointments including 12-15-2021; Discussed plans with patient for ongoing care management follow up and provided patient with direct contact information for care management team;   Hypothyroidism   (Status: Goal on Track (progressing): YES.) Long Term Goal  Evaluation of current treatment plan related to Hypothyroidism,  self-management and patient's adherence to plan as established by  provider. Discussed plans with patient for ongoing care management follow up and provided patient with direct contact information for care management team Advised patient to call the office for changes in condition, questions, or concerns; Reviewed medications with patient and discussed compliance ; Reviewed scheduled/upcoming provider appointments including 12-15-2021; Discussed plans with patient for ongoing care management follow up and provided patient with direct contact information for care management team;   Hypogonadism in Males  (Status: Goal on track: NO.) Long Term Goal  Evaluation of current treatment plan related to  Hypogonadism ,  self-management and patient's adherence to plan as established by provider. Discussed plans with patient for ongoing care management follow up and provided patient with direct contact information for care management team Advised patient to call the office for changes in condition, questions or concerns; Provided education to patient re: other options for medication management of his hypogonadism. The patient states that he reviewed the formulary but there were no real substitutes for him on the formulary.; Reviewed medications with patient and discussed The patient cannot afford the Androgel RX prescribed by pcp. The patient is open to other options. The patient did talk to the [facility] pharmacist last week. He is open to recommendations by the pcp; Collaborated with pcp and pharm D regarding other options for medications mangement of hypogonadism ; Reviewed scheduled/upcoming provider appointments including 12-15-2021; Discussed plans with patient for ongoing care management follow up and provided patient with direct contact information for care management team; Advised patient to discuss other treatment options  with provider;  Hyperlipidemia:  (Status: Goal on Track (progressing): YES.) Long Term Goal  Lab  Results  Component Value Date   CHOL 199 06/17/2021    HDL 47 06/17/2021   LDLCALC 113 (H) 06/17/2021   TRIG 222 (H) 06/17/2021   CHOLHDL 5.9 (H) 12/29/2017     Medication review performed; medication list updated in electronic medical record.  Provider established cholesterol goals reviewed; Counseled on importance of regular laboratory monitoring as prescribed; Provided HLD educational materials; Reviewed role and benefits of statin for ASCVD risk reduction; Discussed strategies to manage statin-induced myalgias; Reviewed importance of limiting foods high in cholesterol; Reviewed exercise goals and target of 150 minutes per week;  Patient Goals/Self-Care Activities: Patient will self administer medications as prescribed as evidenced by self report/primary caregiver report  Patient will attend all scheduled provider appointments as evidenced by clinician review of documented attendance to scheduled appointments and patient/caregiver report Patient will call pharmacy for medication refills as evidenced by patient report and review of pharmacy fill history as appropriate Patient will attend church or other social activities as evidenced by patient report Patient will continue to perform ADL's independently as evidenced by patient/caregiver report Patient will continue to perform IADL's independently as evidenced by patient/caregiver report Patient will call provider office for new concerns or questions as evidenced by review of documented incoming telephone call notes and patient report Patient will work with BSW to address care coordination needs and will continue to work with the clinical team to address health care and disease management related needs as evidenced by documented adherence to scheduled care management/care coordination appointments schedule appointment with eye doctor check blood sugar at prescribed times: when you have symptoms of low or high blood sugar, before and after exercise, and as directed by the pcp   check feet daily  for cuts, sores or redness trim toenails straight across drink 6 to 8 glasses of water each day eat fish at least once per week fill half of plate with vegetables limit fast food meals to no more than 1 per week manage portion size prepare main meal at home 3 to 5 days each week read food labels for fat, fiber, carbohydrates and portion size reduce red meat to 2 to 3 times a week do heel pump exercise 2 to 3 times each day keep feet up while sitting wash and dry feet carefully every day wear comfortable, cotton socks wear comfortable, well-fitting shoes - call for medicine refill 2 or 3 days before it runs out - take all medications exactly as prescribed - call doctor with any symptoms you believe are related to your medicine - call doctor when you experience any new symptoms - go to all doctor appointments as scheduled - adhere to prescribed diet: heart healthy/ADA diet        Plan: Telephone follow up appointment with care management team member scheduled for:  09-23-2021 at 1:45 pm  Alto Denver RN, MSN, CCM Community Care Coordinator [place]  [facility] Mobile: [phone]

## 2021-08-21 NOTE — Progress Notes (Signed)
Hey, unfortunately if they won't cover it,  I can send him to urology, but that's about it. I have no other options.

## 2021-09-08 ENCOUNTER — Other Ambulatory Visit: Payer: 59

## 2021-09-23 ENCOUNTER — Telehealth: Payer: 59

## 2021-10-30 ENCOUNTER — Ambulatory Visit: Payer: 59 | Admitting: Nurse Practitioner

## 2021-11-07 ENCOUNTER — Other Ambulatory Visit: Payer: Self-pay | Admitting: Family Medicine

## 2021-11-08 NOTE — Telephone Encounter (Signed)
Requested Prescriptions  Pending Prescriptions Disp Refills   metFORMIN (GLUCOPHAGE) 500 MG tablet [Pharmacy Med Name: METFORMIN 500MG TABLETS] 180 tablet 0    Sig: TAKE 1 TABLET(500 MG) BY MOUTH TWICE DAILY WITH A MEAL     Endocrinology:  Diabetes - Biguanides Passed - 11/07/2021  3:30 PM      Passed - Cr in normal range and within 360 days    Creatinine, Ser  Date Value Ref Range Status  06/17/2021 1.08 0.76 - 1.27 mg/dL Final         Passed - HBA1C is between 0 and 7.9 and within 180 days    HB A1C (BAYER DCA - WAIVED)  Date Value Ref Range Status  06/17/2021 5.6 4.8 - 5.6 % Final    Comment:             Prediabetes: 5.7 - 6.4          Diabetes: >6.4          Glycemic control for adults with diabetes: <7.0               **Please note reference interval change**          Passed - eGFR in normal range and within 360 days    GFR calc Af Amer  Date Value Ref Range Status  09/02/2020 92 >59 mL/min/1.73 Final    Comment:    **In accordance with recommendations from the NKF-ASN Task force,**   [facility] is in the process of updating its eGFR calculation to the   2021 CKD-EPI creatinine equation that estimates kidney function   without a race variable.    GFR calc non Af Amer  Date Value Ref Range Status  09/02/2020 80 >59 mL/min/1.73 Final   eGFR  Date Value Ref Range Status  06/17/2021 82 >59 mL/min/1.73 Final         Passed - Valid encounter within last 6 months    Recent Outpatient Visits          4 months ago Hypothyroidism, unspecified type   [facility], Megan P, DO   5 months ago Chronic fatigue   [place], Megan P, DO   11 months ago Type 2 diabetes mellitus with hyperglycemia, without long-term current use of insulin ([place])   [place], Megan P, DO   1 year ago Gastroesophageal reflux disease, unspecified whether esophagitis present   [place], Megan P, DO   1 year ago  Pure hypercholesterolemia   [facility] [place], [place]      Future Appointments            In 1 month Wynetta Emery, Barb Merino, DO [employer], [facility]

## 2021-11-13 ENCOUNTER — Telehealth: Payer: 59

## 2021-11-13 ENCOUNTER — Ambulatory Visit: Payer: Self-pay

## 2021-11-13 DIAGNOSIS — E039 Hypothyroidism, unspecified: Secondary | ICD-10-CM

## 2021-11-13 DIAGNOSIS — E291 Testicular hypofunction: Secondary | ICD-10-CM

## 2021-11-13 DIAGNOSIS — E1165 Type 2 diabetes mellitus with hyperglycemia: Secondary | ICD-10-CM

## 2021-11-13 DIAGNOSIS — F339 Major depressive disorder, recurrent, unspecified: Secondary | ICD-10-CM

## 2021-11-13 DIAGNOSIS — F419 Anxiety disorder, unspecified: Secondary | ICD-10-CM

## 2021-11-13 DIAGNOSIS — E7800 Pure hypercholesterolemia, unspecified: Secondary | ICD-10-CM

## 2021-11-13 NOTE — Patient Instructions (Signed)
Visit Information  Thank you for taking time to visit with me today. Please don't hesitate to contact me if I can be of assistance to you before our next scheduled telephone appointment.  Following are the goals we discussed today:  RNCM Clinical Goal(s):  Patient will verbalize understanding of plan for management of HLD, DMII, Anxiety, Depression, Hypothyroidism, and Hypogonadism as evidenced by working with the CCM team, and pcp to effectively manage chronic conditions take all medications exactly as prescribed and will call provider for medication related questions as evidenced by taking medications as prescribed, and working with the pcp and RNCM for assistance with testosterone treatment options     demonstrate improved and ongoing adherence to prescribed treatment plan for HLD, DMII, Anxiety, Depression, Hypothyroidism, and Hypogonadism  as evidenced by keeping appointments, taking medications as directed, following the plan of care, and adherence to heart health/ADA diet demonstrate a decrease in HLD, DMII, Anxiety, Depression, Hypothyroidism, and hypogonadism  exacerbations  as evidenced by working with the CCM team to effectively manage chronic conditions and optimize health and well being demonstrate ongoing self health care management ability by effective management of chronic condtions  as evidenced by working with the CCM team  through collaboration with Medical illustrator[employer] Care manager, provider, and care team.    Interventions: 1:1 collaboration with primary care provider regarding development and update of comprehensive plan of care as evidenced by provider attestation and co-signature Inter-disciplinary care team collaboration (see longitudinal plan of care) Evaluation of current treatment plan related to  self management and patient's adherence to plan as established by provider     SDOH Barriers (Status: Goal on Track (progressing): YES.) Long Term Goal  Patient interviewed and SDOH assessment  performed        SDOH Interventions     Flowsheet Row Most Recent Value  SDOH Interventions    Food Insecurity Interventions Intervention Not Indicated  Financial Strain Interventions Intervention Not Indicated  Housing Interventions Intervention Not Indicated  Intimate Partner Violence Interventions Intervention Not Indicated  Stress Interventions Intervention Not Indicated  Social Connections Interventions Intervention Not Indicated  Transportation Interventions Intervention Not Indicated       Patient interviewed and appropriate assessments performed Provided patient with information about resources available and care guides to assist with new concerns and changes in SDOH Discussed plans with patient for ongoing care management follow up and provided patient with direct contact information for care management team Advised patient to call the office for changes in SDOH, questions or concerns       Diabetes:  (Status: Goal on Track (progressing): YES.) Long Term Goal         Lab Results  Component Value Date    HGBA1C 5.6 06/17/2021  Assessed patient's understanding of A1c goal: <7% Provided education to patient about basic DM disease process; Reviewed medications with patient and discussed importance of medication adherence. 08-21-2021: The patient has started taking the Metformin again, he started this back last week.  Denies any concerns or side effects with Metformin. 11-13-2021: The patient is taking Metformin without any issues. Denies any new concerns;        Reviewed prescribed diet with patient heart healthy/ADA diet. 11-13-2021: The patient states that he has a good appetite. Denies any issues with dietary restrictions; Counseled on importance of regular laboratory monitoring as prescribed;        Discussed plans with patient for ongoing care management follow up and provided patient with direct contact information for care management team;  Provided patient with written  educational materials related to hypo and hyperglycemia and importance of correct treatment. 11-13-2021: Denies any issues with hypoglycemia or hyperglycemia       Reviewed scheduled/upcoming provider appointments including: 12-15-2021 at 0820 am. Encouraged the patient to write down questions to ask the pcp at upcoming visit.         Advised patient, providing education and rationale, to check cbg as directed  and record        call provider for findings outside established parameters;       Review of patient status, including review of consultants reports, relevant laboratory and other test results, and medications completed;       Advised patient to discuss blood sugar trends, changes in medications, new concerns and questions  with provider;          Depression and Anxiety   (Status: Goal on Track (progressing): YES.) Long Term Goal  Evaluation of current treatment plan related to Anxiety and Depression, Mental Health Concerns  self-management and patient's adherence to plan as established by provider. 11-13-2021: The patient states  he really does not have issues with Anxiety but does have depression at times. He just works through it. Denies any sx and sx of hurting self or others. Encouraged the patient to talk about increased depression or new concerns. Will continue to monitor.  Discussed plans with patient for ongoing care management follow up and provided patient with direct contact information for care management team Advised patient to call the office for changes in mood, anxiety, or depression ; Provided education to patient re: doing things he enjoys, being with positive people, working through periods of increased depression and anxiety; Reviewed medications with patient and discussed compliance. 11-13-2021: The patient is compliant with medications; Reviewed scheduled/upcoming provider appointments including 12-15-2021; Discussed plans with patient for ongoing care management follow up and  provided patient with direct contact information for care management team;    Hypothyroidism   (Status: Goal on Track (progressing): YES.) Long Term Goal  Evaluation of current treatment plan related to Hypothyroidism,  self-management and patient's adherence to plan as established by provider. 11-13-2021: The patient denies any issue with his hypothyroidism or any new concerns. Will have regular lab work for evaluation of hypothyroidism Discussed plans with patient for ongoing care management follow up and provided patient with direct contact information for care management team Advised patient to call the office for changes in condition, questions, or concerns; Reviewed medications with patient and discussed compliance. 11-13-2021: the patient is compliant with medications; Reviewed scheduled/upcoming provider appointments including 12-15-2021; Discussed plans with patient for ongoing care management follow up and provided patient with direct contact information for care management team;    Hypogonadism in [place]  (Status: Goal on track: NO.) Long Term Goal  Evaluation of current treatment plan related to  Hypogonadism ,  self-management and patient's adherence to plan as established by provider. 11-13-2021: The patient has not had any changes. The patient states he is having fatigue but nothing more than usual. He is sleeping about 5 to 6 hours a night and this is usual for him.  Discussed plans with patient for ongoing care management follow up and provided patient with direct contact information for care management team Advised patient to call the office for changes in condition, questions or concerns; Provided education to patient re: other options for medication management of his hypogonadism. The patient states that he reviewed the formulary but there were no real substitutes  for him on the formulary. 11-13-2021: The patient could not afford the medications. He will talk to the pcp at upcoming  visit; Reviewed medications with patient and discussed The patient cannot afford the Androgel RX prescribed by pcp. The patient is open to other options. [facility]. He is open to recommendations by the pcp; Collaborated with pcp and pharm D regarding other options for medications mangement of hypogonadism ; Reviewed scheduled/upcoming provider appointments including 12-15-2021; Discussed plans with patient for ongoing care management follow up and provided patient with direct contact information for care management team; Advised patient to discuss other treatment options  with provider;   Hyperlipidemia:  (Status: Goal on Track (progressing): YES.) Long Term Goal       Lab Results  Component Value Date    CHOL 199 06/17/2021    HDL 47 06/17/2021    LDLCALC 113 (H) 06/17/2021    TRIG 222 (H) 06/17/2021    CHOLHDL 5.9 (H) 12/29/2017      Medication review performed; medication list updated in electronic medical record.  Provider established cholesterol goals reviewed; Counseled on importance of regular laboratory monitoring as prescribed. 11-13-2021: The patient has regular labwork; Provided HLD educational materials; Reviewed role and benefits of statin for ASCVD risk reduction; Discussed strategies to manage statin-induced myalgias; Reviewed importance of limiting foods high in cholesterol; Reviewed exercise goals and target of 150 minutes per week;   Patient Goals/Self-Care Activities: Patient will self administer medications as prescribed as evidenced by self report/primary caregiver report  Patient will attend all scheduled provider appointments as evidenced by clinician review of documented attendance to scheduled appointments and patient/caregiver report Patient will call pharmacy for medication refills as evidenced by patient report and review of pharmacy fill history as appropriate Patient will attend church or other social activities as  evidenced by patient report Patient will continue to perform ADL's independently as evidenced by patient/caregiver report Patient will continue to perform IADL's independently as evidenced by patient/caregiver report Patient will call provider office for new concerns or questions as evidenced by review of documented incoming telephone call notes and patient report Patient will work with BSW to address care coordination needs and will continue to work with the clinical team to address health care and disease management related needs as evidenced by documented adherence to scheduled care management/care coordination appointments schedule appointment with eye doctor check blood sugar at prescribed times: when you have symptoms of low or high blood sugar, before and after exercise, and as directed by the pcp   check feet daily for cuts, sores or redness trim toenails straight across drink 6 to 8 glasses of water each day eat fish at least once per week fill half of plate with vegetables limit fast food meals to no more than 1 per week manage portion size prepare main meal at home 3 to 5 days each week read food labels for fat, fiber, carbohydrates and portion size reduce red meat to 2 to 3 times a week do heel pump exercise 2 to 3 times each day keep feet up while sitting wash and dry feet carefully every day wear comfortable, cotton socks wear comfortable, well-fitting shoes - call for medicine refill 2 or 3 days before it runs out - take all medications exactly as prescribed - call doctor with any symptoms you believe are related to your medicine - call doctor when you experience any new symptoms - go to all doctor appointments as  scheduled - adhere to prescribed diet: heart healthy/ADA diet   Our next appointment is by telephone on 01-29-2022 at 1030 am  Please call the care guide team at [phone] if you need to cancel or reschedule your appointment.   If you are experiencing a  Mental Health or Behavioral Health Crisis or need someone to talk to, please call the Suicide and Crisis Lifeline: 988 call the [place] National Suicide Prevention Lifeline: 361-123-0873 or TTY: (718)733-3975 TTY (435)763-0846) to talk to a trained counselor call 1-800-273-TALK (toll free, 24 hour hotline)   Patient verbalizes understanding of instructions and care plan provided today and agrees to view in MyChart. Active MyChart status confirmed with patient.    Telephone follow up appointment with care management team member scheduled for:01-29-2022 at 1030 am  Alto Denver RN, MSN, CCM Community Care Coordinator [place]   [facility] Mobile: [phone]

## 2021-11-13 NOTE — Chronic Care Management (AMB) (Signed)
Care Management    RN Visit Note  11/13/2021 Name: STEPHANIE Gillespie MRN: [id number] DOB: Apr 18, 1966  Subjective: Michael Gillespie is a 56 y.o. year old male who is a primary care patient of Michael Carrow, DO. The care management team was consulted for assistance with disease management and care coordination needs.    Engaged with patient by telephone for follow up visit in response to provider referral for case management and/or care coordination services.   Consent to Services:   Michael Gillespie was given information about Care Management services today including:  Care Management services includes personalized support from designated clinical staff supervised by his physician, including individualized plan of care and coordination with other care providers 24/7 contact phone numbers for assistance for urgent and routine care needs. The patient may stop case management services at any time by phone call to the office staff.  Patient agreed to services and consent obtained.   Assessment: Review of patient past medical history, allergies, medications, health status, including review of consultants reports, laboratory and other test data, was performed as part of comprehensive evaluation and provision of chronic care management services.   SDOH (Social Determinants of Health) assessments and interventions performed:  SDOH Interventions    Flowsheet Row Most Recent Value  SDOH Interventions   Physical Activity Interventions Other (Comments)  [no physical activit, encourage activity]  Social Connections Interventions Other (Comment)  [has good support system]        Care Plan  No Known Allergies  Outpatient Encounter Medications as of 11/13/2021  Medication Sig   ezetimibe (ZETIA) 10 MG tablet Take 1 tablet (10 mg total) by mouth daily.   FLUoxetine (PROZAC) 20 MG tablet Take 1 tablet (20 mg total) by mouth daily.   levothyroxine (SYNTHROID) 100 MCG tablet TAKE 1 TABLET(100 MCG) BY  MOUTH DAILY BEFORE AND BREAKFAST   metFORMIN (GLUCOPHAGE) 500 MG tablet TAKE 1 TABLET(500 MG) BY MOUTH TWICE DAILY WITH A MEAL   testosterone (ANDROGEL) 50 MG/5GM (1%) GEL Place 5 g onto the skin daily.   traZODone (DESYREL) 50 MG tablet Take 0.5-1 tablets (25-50 mg total) by mouth at bedtime as needed for sleep.   Vitamin D, Ergocalciferol, (DRISDOL) 1.25 MG (50000 UNIT) CAPS capsule Take 1 capsule (50,000 Units total) by mouth every 7 (seven) days.   No facility-administered encounter medications on file as of 11/13/2021.    Patient Active Problem List   Diagnosis Date Noted   Chronic fatigue 05/22/2021   Type 2 diabetes mellitus with hyperglycemia (HCC) 06/06/2020   Insomnia 10/07/2017   Exophthalmos 03/03/2017   Fatty liver disease, nonalcoholic 10/20/2016   Hypogonadism in male 04/28/2016   Elevated LFTs 04/26/2016   Depression, recurrent (HCC) 02/25/2016   Hyperlipidemia    GERD (gastroesophageal reflux disease)    Hypothyroidism    History of nephrolithiasis    Anxiety     Conditions to be addressed/monitored: HTN, DMII, Anxiety, Depression, Hypothyroidism, and Hypogonadism in Male  Care Plan : RNCM: General Plan of Care (Adult) for Chronic Disease Management and Care Coordination Needs  Updates made by Michael Sax, RN since 11/13/2021 12:00 AM     Problem: RNCM: Devlopment of plan of care for Chronic Disease Management (Hypothyroidism, DM2, HTN, Hypogonadism in Male, Anxiety and Depression)   Priority: High     Long-Range Goal: RNCM: Devlopment of plan of care for Chronic Disease Management (Hypothyroidism, DM2, HTN, Hypogonadism in Male, Anxiety and Depression)   Start Date: 08/21/2021  Expected  End Date: 08/21/2022  Priority: High  Note:   Current Barriers:  Knowledge Deficits related to plan of care for management of HLD, DMII, Anxiety with Excessive Worry, and Depression: depressed mood anxiety, and hypothyroidism and hypogonadism   Care Coordination needs  related to Financial constraints related to inability to afford testosterone medications and needs alternate medication options  and Medication procurement  Chronic Disease Management support and education needs related to HLD, DMII, Anxiety with Excessive Worry, and Depression: depressed mood anxiety, and hypothyroidism and hypogonadism Financial Constraints.   RNCM Clinical Goal(s):  Patient will verbalize understanding of plan for management of HLD, DMII, Anxiety, Depression, Hypothyroidism, and Hypogonadism as evidenced by working with the CCM team, and pcp to effectively manage chronic conditions take all medications exactly as prescribed and will call provider for medication related questions as evidenced by taking medications as prescribed, and working with the pcp and RNCM for assistance with testosterone treatment options     demonstrate improved and ongoing adherence to prescribed treatment plan for HLD, DMII, Anxiety, Depression, Hypothyroidism, and Hypogonadism  as evidenced by keeping appointments, taking medications as directed, following the plan of care, and adherence to heart health/ADA diet demonstrate a decrease in HLD, DMII, Anxiety, Depression, Hypothyroidism, and hypogonadism  exacerbations  as evidenced by working with the CCM team to effectively manage chronic conditions and optimize health and well being demonstrate ongoing self health care management ability by effective management of chronic condtions  as evidenced by working with the CCM team  through collaboration with Medical illustrator, provider, and care team.   Interventions: 1:1 collaboration with primary care provider regarding development and update of comprehensive plan of care as evidenced by provider attestation and co-signature Inter-disciplinary care team collaboration (see longitudinal plan of care) Evaluation of current treatment plan related to  self management and patient's adherence to plan as established by  provider   SDOH Barriers (Status: Goal on Track (progressing): YES.) Long Term Goal  Patient interviewed and SDOH assessment performed        SDOH Interventions    Flowsheet Row Most Recent Value  SDOH Interventions   Food Insecurity Interventions Intervention Not Indicated  Financial Strain Interventions Intervention Not Indicated  Housing Interventions Intervention Not Indicated  Intimate Partner Violence Interventions Intervention Not Indicated  Stress Interventions Intervention Not Indicated  Social Connections Interventions Intervention Not Indicated  Transportation Interventions Intervention Not Indicated     Patient interviewed and appropriate assessments performed Provided patient with information about resources available and care guides to assist with new concerns and changes in SDOH Discussed plans with patient for ongoing care management follow up and provided patient with direct contact information for care management team Advised patient to call the office for changes in SDOH, questions or concerns    Diabetes:  (Status: Goal on Track (progressing): YES.) Long Term Goal   Lab Results  Component Value Date   HGBA1C 5.6 06/17/2021  Assessed patient's understanding of A1c goal: <7% Provided education to patient about basic DM disease process; Reviewed medications with patient and discussed importance of medication adherence. 08-21-2021: The patient has started taking the Metformin again, he started this back last week.  Denies any concerns or side effects with Metformin. 11-13-2021: The patient is taking Metformin without any issues. Denies any new concerns;        Reviewed prescribed diet with patient heart healthy/ADA diet. 11-13-2021: The patient states that he has a good appetite. Denies any issues with dietary restrictions; Counseled on importance  of regular laboratory monitoring as prescribed;        Discussed plans with patient for ongoing care management follow up  and provided patient with direct contact information for care management team;      Provided patient with written educational materials related to hypo and hyperglycemia and importance of correct treatment. 11-13-2021: Denies any issues with hypoglycemia or hyperglycemia       Reviewed scheduled/upcoming provider appointments including: 12-15-2021 at 0820 am. Encouraged the patient to write down questions to ask the pcp at upcoming visit.         Advised patient, providing education and rationale, to check cbg as directed  and record        call provider for findings outside established parameters;       Review of patient status, including review of consultants reports, relevant laboratory and other test results, and medications completed;       Advised patient to discuss blood sugar trends, changes in medications, new concerns and questions  with provider;        Depression and Anxiety   (Status: Goal on Track (progressing): YES.) Long Term Goal  Evaluation of current treatment plan related to Anxiety and Depression, Mental Health Concerns  self-management and patient's adherence to plan as established by provider. 11-13-2021: The patient states  he really does not have issues with Anxiety but does have depression at times. He just works through it. Denies any sx and sx of hurting self or others. Encouraged the patient to talk about increased depression or new concerns. Will continue to monitor.  Discussed plans with patient for ongoing care management follow up and provided patient with direct contact information for care management team Advised patient to call the office for changes in mood, anxiety, or depression ; Provided education to patient re: doing things he enjoys, being with positive people, working through periods of increased depression and anxiety; Reviewed medications with patient and discussed compliance. 11-13-2021: The patient is compliant with medications; Reviewed scheduled/upcoming  provider appointments including 12-15-2021; Discussed plans with patient for ongoing care management follow up and provided patient with direct contact information for care management team;   Hypothyroidism   (Status: Goal on Track (progressing): YES.) Long Term Goal  Evaluation of current treatment plan related to Hypothyroidism,  self-management and patient's adherence to plan as established by provider. 11-13-2021: The patient denies any issue with his hypothyroidism or any new concerns. Will have regular lab work for evaluation of hypothyroidism Discussed plans with patient for ongoing care management follow up and provided patient with direct contact information for care management team Advised patient to call the office for changes in condition, questions, or concerns; Reviewed medications with patient and discussed compliance. 11-13-2021: the patient is compliant with medications; Reviewed scheduled/upcoming provider appointments including 12-15-2021; Discussed plans with patient for ongoing care management follow up and provided patient with direct contact information for care management team;   Hypogonadism in Males  (Status: Goal on track: NO.) Long Term Goal  Evaluation of current treatment plan related to  Hypogonadism ,  self-management and patient's adherence to plan as established by provider. 11-13-2021: The patient has not had any changes. The patient states he is having fatigue but nothing more than usual. He is sleeping about 5 to 6 hours a night and this is usual for him.  Discussed plans with patient for ongoing care management follow up and provided patient with direct contact information for care management team Advised patient to call the  office for changes in condition, questions or concerns; Provided education to patient re: other options for medication management of his hypogonadism. The patient states that he reviewed the formulary but there were no real substitutes for him on the  formulary. 11-13-2021: The patient could not afford the medications. He will talk to the pcp at upcoming visit; Reviewed medications with patient and discussed The patient cannot afford the Androgel RX prescribed by pcp. The patient is open to other options. The patient did talk to the [facility] pharmacist last week. He is open to recommendations by the pcp; Collaborated with pcp and pharm D regarding other options for medications mangement of hypogonadism ; Reviewed scheduled/upcoming provider appointments including 12-15-2021; Discussed plans with patient for ongoing care management follow up and provided patient with direct contact information for care management team; Advised patient to discuss other treatment options  with provider;  Hyperlipidemia:  (Status: Goal on Track (progressing): YES.) Long Term Goal  Lab Results  Component Value Date   CHOL 199 06/17/2021   HDL 47 06/17/2021   LDLCALC 113 (H) 06/17/2021   TRIG 222 (H) 06/17/2021   CHOLHDL 5.9 (H) 12/29/2017     Medication review performed; medication list updated in electronic medical record.  Provider established cholesterol goals reviewed; Counseled on importance of regular laboratory monitoring as prescribed. 11-13-2021: The patient has regular labwork; Provided HLD educational materials; Reviewed role and benefits of statin for ASCVD risk reduction; Discussed strategies to manage statin-induced myalgias; Reviewed importance of limiting foods high in cholesterol; Reviewed exercise goals and target of 150 minutes per week;  Patient Goals/Self-Care Activities: Patient will self administer medications as prescribed as evidenced by self report/primary caregiver report  Patient will attend all scheduled provider appointments as evidenced by clinician review of documented attendance to scheduled appointments and patient/caregiver report Patient will call pharmacy for medication refills as evidenced by patient report and review of pharmacy  fill history as appropriate Patient will attend church or other social activities as evidenced by patient report Patient will continue to perform ADL's independently as evidenced by patient/caregiver report Patient will continue to perform IADL's independently as evidenced by patient/caregiver report Patient will call provider office for new concerns or questions as evidenced by review of documented incoming telephone call notes and patient report Patient will work with BSW to address care coordination needs and will continue to work with the clinical team to address health care and disease management related needs as evidenced by documented adherence to scheduled care management/care coordination appointments schedule appointment with eye doctor check blood sugar at prescribed times: when you have symptoms of low or high blood sugar, before and after exercise, and as directed by the pcp   check feet daily for cuts, sores or redness trim toenails straight across drink 6 to 8 glasses of water each day eat fish at least once per week fill half of plate with vegetables limit fast food meals to no more than 1 per week manage portion size prepare main meal at home 3 to 5 days each week read food labels for fat, fiber, carbohydrates and portion size reduce red meat to 2 to 3 times a week do heel pump exercise 2 to 3 times each day keep feet up while sitting wash and dry feet carefully every day wear comfortable, cotton socks wear comfortable, well-fitting shoes - call for medicine refill 2 or 3 days before it runs out - take all medications exactly as prescribed - call doctor with any symptoms you  believe are related to your medicine - call doctor when you experience any new symptoms - go to all doctor appointments as scheduled - adhere to prescribed diet: heart healthy/ADA diet        Plan: Telephone follow up appointment with care management team member scheduled for:  01-29-2022 at 1030  am  Alto DenverPam Clevie Prout RN, MSN, CCM Community Care Coordinator [place]   [facility] Mobile: [phone]

## 2021-11-25 ENCOUNTER — Telehealth: Payer: Self-pay

## 2021-11-25 MED ORDER — FLUOXETINE HCL 20 MG PO CAPS: 20.0000 mg | ORAL_CAPSULE | Freq: Every day | ORAL | 1 refills | Status: DC

## 2021-11-25 NOTE — Telephone Encounter (Signed)
Drug change request.  Fluoxetine 20 MG tablets not covered by patient plan. Alternative is Fluoxetine CAPMG.

## 2021-12-14 DIAGNOSIS — M25561 Pain in right knee: Secondary | ICD-10-CM | POA: Insufficient documentation

## 2021-12-15 ENCOUNTER — Encounter: Payer: 59 | Admitting: Family Medicine

## 2021-12-20 ENCOUNTER — Encounter: Payer: Self-pay | Admitting: Family Medicine

## 2022-01-15 ENCOUNTER — Encounter: Payer: Self-pay | Admitting: Family Medicine

## 2022-01-15 ENCOUNTER — Ambulatory Visit (INDEPENDENT_AMBULATORY_CARE_PROVIDER_SITE_OTHER): Payer: 59 | Admitting: Family Medicine

## 2022-01-15 VITALS — BP 110/75 | HR 87 | Temp 98.0°F | Ht 65.5 in | Wt 210.0 lb

## 2022-01-15 DIAGNOSIS — E039 Hypothyroidism, unspecified: Secondary | ICD-10-CM | POA: Diagnosis not present

## 2022-01-15 DIAGNOSIS — E291 Testicular hypofunction: Secondary | ICD-10-CM

## 2022-01-15 DIAGNOSIS — Z1159 Encounter for screening for other viral diseases: Secondary | ICD-10-CM

## 2022-01-15 DIAGNOSIS — Z0000 Encounter for general adult medical examination without abnormal findings: Secondary | ICD-10-CM | POA: Diagnosis not present

## 2022-01-15 DIAGNOSIS — F419 Anxiety disorder, unspecified: Secondary | ICD-10-CM

## 2022-01-15 DIAGNOSIS — E7800 Pure hypercholesterolemia, unspecified: Secondary | ICD-10-CM | POA: Diagnosis not present

## 2022-01-15 DIAGNOSIS — F339 Major depressive disorder, recurrent, unspecified: Secondary | ICD-10-CM

## 2022-01-15 DIAGNOSIS — Z1211 Encounter for screening for malignant neoplasm of colon: Secondary | ICD-10-CM

## 2022-01-15 DIAGNOSIS — E1165 Type 2 diabetes mellitus with hyperglycemia: Secondary | ICD-10-CM

## 2022-01-15 LAB — MICROSCOPIC EXAMINATION
Bacteria, UA: NONE SEEN
WBC, UA: NONE SEEN /hpf (ref 0–5)

## 2022-01-15 LAB — URINALYSIS, ROUTINE W REFLEX MICROSCOPIC
Bilirubin, UA: NEGATIVE
Glucose, UA: NEGATIVE
Ketones, UA: NEGATIVE
Leukocytes,UA: NEGATIVE
Nitrite, UA: NEGATIVE
Protein,UA: NEGATIVE
Specific Gravity, UA: 1.025 (ref 1.005–1.030)
Urobilinogen, Ur: 0.2 mg/dL (ref 0.2–1.0)
pH, UA: 6.5 (ref 5.0–7.5)

## 2022-01-15 LAB — MICROALBUMIN, URINE WAIVED
Creatinine, Urine Waived: 200 mg/dL (ref 10–300)
Microalb, Ur Waived: 30 mg/L — ABNORMAL HIGH (ref 0–19)
Microalb/Creat Ratio: <30 mg/g (ref <30)

## 2022-01-15 LAB — BAYER DCA HB A1C WAIVED: HB A1C (BAYER DCA - WAIVED): 5.7 % — ABNORMAL HIGH (ref 4.8–5.6)

## 2022-01-15 MED ORDER — FLUOXETINE HCL 20 MG PO CAPS: 20.0000 mg | ORAL_CAPSULE | Freq: Every day | ORAL | 1 refills | Status: DC

## 2022-01-15 MED ORDER — METFORMIN HCL 500 MG PO TABS: ORAL_TABLET | ORAL | 1 refills | Status: DC

## 2022-01-15 MED ORDER — EZETIMIBE 10 MG PO TABS
10.0000 mg | ORAL_TABLET | Freq: Every day | ORAL | 1 refills | Status: DC
Start: 2022-01-15 — End: 2022-07-09

## 2022-01-15 NOTE — Assessment & Plan Note (Signed)
Under good control on current regimen. Continue current regimen. Continue to monitor. Call with any concerns. Refills given. Labs drawn today.   

## 2022-01-15 NOTE — Assessment & Plan Note (Signed)
Not on treatment due to cost. Rechecking labs today. Await results. Treat as needed.  ?

## 2022-01-15 NOTE — Assessment & Plan Note (Signed)
Rechecking labs today. Await results. Treat as needed.  °

## 2022-01-15 NOTE — Progress Notes (Signed)
? ?BP 110/75   Pulse 87   Temp 98 ?F (36.7 ?C)   Ht 5' 5.5" (1.664 m)   Wt 210 lb (95.3 kg)   SpO2 98%   BMI 34.41 kg/m?   ? ?Subjective:  ? ? Patient ID: Michael Gillespie, male    DOB: [birth year], 56 y.o.   MRN: [id number] ? ?HPI: ?Michael Gillespie is a 56 y.o. male presenting on 01/15/2022 for comprehensive medical examination. Current medical complaints include: ? ?DIABETES ?Hypoglycemic episodes:no ?Polydipsia/polyuria: no ?Visual disturbance: no ?Chest pain: no ?Paresthesias: no ?Glucose Monitoring: no ? Accucheck frequency: Not Checking ?Taking Insulin?: no ?Blood Pressure Monitoring: not checking ?Retinal Examination: Not up to Date ?Foot Exam: Up to Date ?Diabetic Education: Completed ?Pneumovax: Up to Date ?Influenza: Up to Date ?Aspirin: no ? ?HYPERLIPIDEMIA ?Hyperlipidemia status: excellent compliance ?Satisfied with current treatment?  no ?Side effects:  no ?Medication compliance: excellent compliance ?Past cholesterol meds: zetia ?Supplements: fish oil ?Aspirin:  no ?The 10-year ASCVD risk score (Arnett DK, et al., 2019) is: 7.8% ?  Values used to calculate the score: ?    Age: 44 years ?    Sex: Male ?    Is Non-Hispanic African American: No ?    Diabetic: Yes ?    Tobacco smoker: No ?    Systolic Blood Pressure: 110 mmHg ?    Is BP treated: No ?    HDL Cholesterol: 47 mg/dL ?    Total Cholesterol: 199 mg/dL ?Chest pain:  no ?Coronary artery disease:  no ? ?HYPOTHYROIDISM ?Thyroid control status:controlled ?Satisfied with current treatment? yes ?Medication side effects: no ?Medication compliance: excellent compliance ?Recent dose adjustment:no ?Fatigue: yes ?Cold intolerance: no ?Heat intolerance: no ?Weight gain: no ?Weight loss: no ?Constipation: no ?Diarrhea/loose stools: no ?Palpitations: no ?Lower extremity edema: no ?Anxiety/depressed mood: no ? ?ANXIETY/STRESS ?Duration: chronic ?Status:controlled ?Anxious mood: no  ?Excessive worrying: no ?Irritability: no  ?Sweating: no ?Nausea:  no ?Palpitations:no ?Hyperventilation: no ?Panic attacks: no ?Agoraphobia: no  ?Obscessions/compulsions: no ?Depressed mood: no ? ?  01/15/2022  ?  8:48 AM 08/21/2021  ?  1:37 PM 06/17/2021  ?  8:43 AM 12/05/2020  ?  8:50 AM 09/02/2020  ?  9:08 AM  ?Depression screen PHQ 2/9  ?Decreased Interest 0 0 0 0 0  ?Down, Depressed, Hopeless 0 0 0 0 0  ?PHQ - 2 Score 0 0 0 0 0  ?Altered sleeping 0  0 0 0  ?Tired, decreased energy 1  1 0 0  ?Change in appetite 0  0 0 0  ?Feeling bad or failure about yourself  0  0 0 0  ?Trouble concentrating 0  0 0 0  ?Moving slowly or fidgety/restless 0  0 0 0  ?Suicidal thoughts 0  0 0 0  ?PHQ-9 Score 1  1 0 0  ? ?Anhedonia: no ?Weight changes: no ?Insomnia: no   ?Hypersomnia: no ?Fatigue/loss of energy: yes ?Feelings of worthlessness: no ?Feelings of guilt: no ?Impaired concentration/indecisiveness: no ?Suicidal ideations: no  ?Crying spells: no ?Recent Stressors/Life Changes: no ?  Relationship problems: no ?  Family stress: no   ?  Financial stress: no  ?  Job stress: no  ?  Recent death/loss: no ? ?He currently lives with: wife ?Interim Problems from his last visit: no ? ?Depression Screen done today and results listed below:  ? ?  01/15/2022  ?  8:48 AM 08/21/2021  ?  1:37 PM 06/17/2021  ?  8:43 AM 12/05/2020  ?  8:50 AM 09/02/2020  ?  9:08 AM  ?Depression screen PHQ 2/9  ?Decreased Interest 0 0 0 0 0  ?Down, Depressed, Hopeless 0 0 0 0 0  ?PHQ - 2 Score 0 0 0 0 0  ?Altered sleeping 0  0 0 0  ?Tired, decreased energy 1  1 0 0  ?Change in appetite 0  0 0 0  ?Feeling bad or failure about yourself  0  0 0 0  ?Trouble concentrating 0  0 0 0  ?Moving slowly or fidgety/restless 0  0 0 0  ?Suicidal thoughts 0  0 0 0  ?PHQ-9 Score 1  1 0 0  ? ? ?Past Medical History:  ?Past Medical History:  ?Diagnosis Date  ? Anxiety   ? GERD (gastroesophageal reflux disease)   ? History of nephrolithiasis   ? Hyperlipidemia   ? Hypothyroidism   ? Low testosterone   ? ? ?Surgical History:  ?History reviewed. No  pertinent surgical history. ? ?Medications:  ?Current Outpatient Medications on File Prior to Visit  ?Medication Sig  ? levothyroxine (SYNTHROID) 100 MCG tablet TAKE 1 TABLET(100 MCG) BY MOUTH DAILY BEFORE AND BREAKFAST  ? Vitamin D, Ergocalciferol, (DRISDOL) 1.25 MG (50000 UNIT) CAPS capsule Take 1 capsule (50,000 Units total) by mouth every 7 (seven) days.  ? ?No current facility-administered medications on file prior to visit.  ? ? ?Allergies:  ?No Known Allergies ? ?Social History:  ?Social History  ? ?Socioeconomic History  ? Marital status: Married  ?  Spouse name: Not on file  ? Number of children: Not on file  ? Years of education: Not on file  ? Highest education level: Not on file  ?Occupational History  ? Not on file  ?Tobacco Use  ? Smoking status: Never  ? Smokeless tobacco: Never  ?Vaping Use  ? Vaping Use: Never used  ?Substance and Sexual Activity  ? Alcohol use: No  ? Drug use: No  ? Sexual activity: Not Currently  ?Other Topics Concern  ? Not on file  ?Social History Narrative  ? Not on file  ? ?Social Determinants of Health  ? ?Financial Resource Strain: Low Risk   ? Difficulty of Paying Living Expenses: Not hard at all  ?Food Insecurity: No Food Insecurity  ? Worried About Programme researcher, broadcasting/film/video in the Last Year: Never true  ? Ran Out of Food in the Last Year: Never true  ?Transportation Needs: No Transportation Needs  ? Lack of Transportation (Medical): No  ? Lack of Transportation (Non-Medical): No  ?Physical Activity: Inactive  ? Days of Exercise per Week: 0 days  ? Minutes of Exercise per Session: 0 min  ?Stress: No Stress Concern Present  ? Feeling of Stress : Not at all  ?Social Connections: Moderately Isolated  ? Frequency of Communication with Friends and Family: More than three times a week  ? Frequency of Social Gatherings with Friends and Family: More than three times a week  ? Attends Religious Services: Never  ? Active Member of Clubs or Organizations: No  ? Attends Banker  Meetings: Never  ? Marital Status: Married  ?Intimate Partner Violence: Not At Risk  ? Fear of Current or Ex-Partner: No  ? Emotionally Abused: No  ? Physically Abused: No  ? Sexually Abused: No  ? ?Social History  ? ?Tobacco Use  ?Smoking Status Never  ?Smokeless Tobacco Never  ? ?Social History  ? ?Substance and Sexual Activity  ?Alcohol Use No  ? ? ?  Family History:  ?Family History  ?Problem Relation Age of Onset  ? Mental illness Mother   ? Crohn's disease Mother   ? Cancer Father   ?     prostate  ? Stroke Maternal Grandmother   ? Bladder Cancer Neg Hx   ? Kidney cancer Neg Hx   ? ? ?Past medical history, surgical history, medications, allergies, family history and social history reviewed with patient today and changes made to appropriate areas of the chart.  ? ?Review of Systems  ?Constitutional: Negative.   ?HENT: Negative.    ?Eyes: Negative.   ?Respiratory: Negative.    ?Cardiovascular: Negative.   ?Gastrointestinal: Negative.   ?Genitourinary: Negative.   ?Musculoskeletal: Negative.   ?Skin: Negative.   ?Neurological: Negative.   ?Endo/Heme/Allergies: Negative.   ?Psychiatric/Behavioral: Negative.    ?All other ROS negative except what is listed above and in the HPI.  ? ?   ?Objective:  ?  ?BP 110/75   Pulse 87   Temp 98 ?F (36.7 ?C)   Ht 5' 5.5" (1.664 m)   Wt 210 lb (95.3 kg)   SpO2 98%   BMI 34.41 kg/m?   ?Wt Readings from Last 3 Encounters:  ?01/15/22 210 lb (95.3 kg)  ?06/17/21 204 lb 3.2 oz (92.6 kg)  ?05/22/21 198 lb 6.4 oz (90 kg)  ?  ?Physical Exam ?Vitals and nursing note reviewed.  ?Constitutional:   ?   General: He is not in acute distress. ?   Appearance: Normal appearance. He is obese. He is not ill-appearing, toxic-appearing or diaphoretic.  ?HENT:  ?   Head: Normocephalic and atraumatic.  ?   Right Ear: Tympanic membrane, ear canal and external ear normal. There is no impacted cerumen.  ?   Left Ear: Tympanic membrane, ear canal and external ear normal. There is no impacted cerumen.   ?   Nose: Nose normal. No congestion or rhinorrhea.  ?   Mouth/Throat:  ?   Mouth: Mucous membranes are moist.  ?   Pharynx: Oropharynx is clear. No oropharyngeal exudate or posterior oropharyngeal erythema.  ?Eyes:  ?   General:

## 2022-01-15 NOTE — Assessment & Plan Note (Signed)
Doing great with A1c of 5.7. Will cut his metformin down to 1x a day and recheck 3 months. Call with any concerns.  ?

## 2022-01-16 ENCOUNTER — Other Ambulatory Visit: Payer: Self-pay | Admitting: Family Medicine

## 2022-01-16 DIAGNOSIS — E039 Hypothyroidism, unspecified: Secondary | ICD-10-CM

## 2022-01-16 MED ORDER — LEVOTHYROXINE SODIUM 112 MCG PO TABS: 112.0000 ug | ORAL_TABLET | Freq: Every day | ORAL | 2 refills | Status: DC

## 2022-01-19 LAB — CBC WITH DIFFERENTIAL/PLATELET
Basophils Absolute: 0 10*3/uL (ref 0.0–0.2)
Basos: 1 %
EOS (ABSOLUTE): 0.3 10*3/uL (ref 0.0–0.4)
Eos: 5 %
Hematocrit: 41.2 % (ref 37.5–51.0)
Hemoglobin: 13.9 g/dL (ref 13.0–17.7)
Immature Grans (Abs): 0.1 10*3/uL (ref 0.0–0.1)
Immature Granulocytes: 1 %
Lymphocytes Absolute: 1.9 10*3/uL (ref 0.7–3.1)
Lymphs: 34 %
MCH: 29.8 pg (ref 26.6–33.0)
MCHC: 33.7 g/dL (ref 31.5–35.7)
MCV: 88 fL (ref 79–97)
Monocytes Absolute: 0.5 10*3/uL (ref 0.1–0.9)
Monocytes: 9 %
Neutrophils Absolute: 3 10*3/uL (ref 1.4–7.0)
Neutrophils: 50 %
Platelets: 217 10*3/uL (ref 150–450)
RBC: 4.67 x10E6/uL (ref 4.14–5.80)
RDW: 13 % (ref 11.6–15.4)
WBC: 5.8 10*3/uL (ref 3.4–10.8)

## 2022-01-19 LAB — COMPREHENSIVE METABOLIC PANEL
ALT: 36 IU/L (ref 0–44)
AST: 24 IU/L (ref 0–40)
Albumin/Globulin Ratio: 2.1 (ref 1.2–2.2)
Albumin: 4.7 g/dL (ref 3.8–4.9)
Alkaline Phosphatase: 57 IU/L (ref 44–121)
BUN/Creatinine Ratio: 18 (ref 9–20)
BUN: 21 mg/dL (ref 6–24)
Bilirubin Total: 0.2 mg/dL (ref 0.0–1.2)
CO2: 26 mmol/L (ref 20–29)
Calcium: 9.3 mg/dL (ref 8.7–10.2)
Chloride: 101 mmol/L (ref 96–106)
Creatinine, Ser: 1.16 mg/dL (ref 0.76–1.27)
Globulin, Total: 2.2 g/dL (ref 1.5–4.5)
Glucose: 103 mg/dL — ABNORMAL HIGH (ref 70–99)
Potassium: 4.6 mmol/L (ref 3.5–5.2)
Sodium: 141 mmol/L (ref 134–144)
Total Protein: 6.9 g/dL (ref 6.0–8.5)
eGFR: 75 mL/min/{1.73_m2} (ref >59)

## 2022-01-19 LAB — TESTOSTERONE, FREE, TOTAL, SHBG
Sex Hormone Binding: 13 nmol/L — ABNORMAL LOW (ref 19.3–76.4)
Testosterone, Free: 5.6 pg/mL — ABNORMAL LOW (ref 7.2–24.0)
Testosterone: 188 ng/dL — ABNORMAL LOW (ref 264–916)

## 2022-01-19 LAB — HEPATITIS C ANTIBODY: Hep C Virus Ab: NONREACTIVE

## 2022-01-19 LAB — LIPID PANEL W/O CHOL/HDL RATIO
Cholesterol, Total: 184 mg/dL (ref 100–199)
HDL: 39 mg/dL — ABNORMAL LOW (ref >39)
LDL Chol Calc (NIH): 96 mg/dL (ref 0–99)
Triglycerides: 291 mg/dL — ABNORMAL HIGH (ref 0–149)
VLDL Cholesterol Cal: 49 mg/dL — ABNORMAL HIGH (ref 5–40)

## 2022-01-19 LAB — PSA: Prostate Specific Ag, Serum: 0.9 ng/mL (ref 0.0–4.0)

## 2022-01-19 LAB — TSH: TSH: 4.82 u[IU]/mL — ABNORMAL HIGH (ref 0.450–4.500)

## 2022-01-23 ENCOUNTER — Other Ambulatory Visit: Payer: Self-pay | Admitting: Family Medicine

## 2022-01-25 NOTE — Telephone Encounter (Signed)
rx was sent to pharmacy on 01/15/22 #90/1 Receipt confirmed by pharmacy (01/15/2022 ?9:12 AM EDT) ?Requested Prescriptions  ?Pending Prescriptions Disp Refills  ?? ezetimibe (ZETIA) 10 MG tablet [Pharmacy Med Name: EZETIMIBE 10MG  TABLETS] 90 tablet 1  ?  Sig: TAKE 1 TABLET(10 MG) BY MOUTH DAILY  ?  ? Cardiovascular:  Antilipid - Sterol Transport Inhibitors Failed - 01/23/2022  3:12 AM  ?  ?  Failed - Lipid Panel in normal range within the last 12 months  ?  Cholesterol, Total  ?Date Value Ref Range Status  ?01/15/2022 184 100 - 199 mg/dL Final  ? ?Cholesterol Piccolo, [place]  ?Date Value Ref Range Status  ?07/12/2018 214 (H) <200 mg/dL Final  ?  Comment:  ?                          Desirable                <200 ?                        Borderline High      200- 239 ?                        High                     >239 ?  ? ?LDL Chol Calc (NIH)  ?Date Value Ref Range Status  ?01/15/2022 96 0 - 99 mg/dL Final  ? ?HDL  ?Date Value Ref Range Status  ?01/15/2022 39 (L) >39 mg/dL Final  ? ?Triglycerides  ?Date Value Ref Range Status  ?01/15/2022 291 (H) 0 - 149 mg/dL Final  ? ?Triglycerides Piccolo,Waived  ?Date Value Ref Range Status  ?07/12/2018 298 (H) <150 mg/dL Final  ?  Comment:  ?                          Normal                   <150 ?                        Borderline High     150 - 199 ?                        High                200 - 499 ?                        Very High                >499 ?  ? ?  ?  ?  Passed - AST in normal range and within 360 days  ?  AST  ?Date Value Ref Range Status  ?01/15/2022 24 0 - 40 IU/L Final  ? ?AST (SGOT) Piccolo, Waived  ?Date Value Ref Range Status  ?07/12/2018 40 (H) 11 - 38 U/L Final  ?   ?  ?  Passed - ALT in normal range and within 360 days  ?  ALT  ?Date Value Ref Range Status  ?01/15/2022 36 0 - 44 IU/L Final  ? ?ALT (SGPT) Piccolo, Waived  ?Date Value Ref Range Status  ?07/12/2018 61 (H) 10 -  47 U/L Final  ?   ?  ?  Passed - Patient is not pregnant  ?  ?  Passed - Valid  encounter within last 12 months  ?  Recent Outpatient Visits   ?      ? 1 week ago Routine general medical examination at a health care facility  ? [facility] [place], [place] P, DO  ? 7 months ago Hypothyroidism, unspecified type  ? [facility] [place], Megan P, DO  ? 8 months ago Chronic fatigue  ? [facility] [place], [place] P, DO  ? 1 year ago Type 2 diabetes mellitus with hyperglycemia, without long-term current use of insulin (HCC)  ? [facility] [place], [place] P, DO  ? 1 year ago Gastroesophageal reflux disease, unspecified whether esophagitis present  ? [facility] [place], [place] P, DO  ?  ?  ?Future Appointments   ?        ? In 3 months Laural Benes, Oralia Rud, DO [facility]  ?  ? ?  ?  ?  ? ? ?

## 2022-01-29 ENCOUNTER — Telehealth: Payer: Self-pay

## 2022-01-29 ENCOUNTER — Telehealth: Payer: 59

## 2022-01-29 NOTE — Telephone Encounter (Signed)
?  Care Management  ? ?Follow Up Note ? ? ?01/29/2022 ?Name: GIANNY RUPE MRN: [id number] DOB: 1966-07-02 ? ? ?Referred by: Valerie Roys, DO ?Reason for referral : Care Coordination (RNCM: Follow up for Chronic Disease Management and Care Coordination Needs) ? ? ?An unsuccessful telephone outreach was attempted today. The patient was referred to the case management team for assistance with care management and care coordination.  ? ?Follow Up Plan: The care management team will reach out to the patient again over the next 30 to 60 days.  ? ?Noreene Larsson RN, MSN, CCM ?Community Care Coordinator ?[facility] ?[place] ?Mobile: [phone]  ?

## 2022-02-09 ENCOUNTER — Telehealth: Payer: Self-pay

## 2022-02-09 NOTE — Chronic Care Management (AMB) (Signed)
?  Chronic Care Management ?Note ? ?02/09/2022 ?Name: Michael Gillespie MRN: [id number] DOB: 1966/09/22 ? ?Michael Gillespie is a 56 y.o. year old male who is a primary care patient of Valerie Roys, DO and is actively engaged with the care management team. I reached out to Syliva Overman by phone today to assist with re-scheduling a follow up visit with the RN Case Manager ? ?Follow up plan: ?Patient declines further follow up and engagement by the care management team. Appropriate care team members and provider have been notified via electronic communication.  ? ?Noreene Larsson, RMA ?Care Guide, Embedded Care Coordination ?[facility]  ?[place], [place] [address] ?Direct Dial: [phone] ?[email or web address] ?[email or web address]: [email or web address] \ ?

## 2022-03-04 ENCOUNTER — Encounter: Payer: Self-pay | Admitting: Family Medicine

## 2022-03-04 MED ORDER — LORAZEPAM 1 MG PO TABS: 1.0000 mg | ORAL_TABLET | Freq: Every day | ORAL | 0 refills | Status: DC | PRN

## 2022-04-27 ENCOUNTER — Other Ambulatory Visit: Payer: Self-pay | Admitting: Family Medicine

## 2022-04-27 DIAGNOSIS — E039 Hypothyroidism, unspecified: Secondary | ICD-10-CM

## 2022-04-28 ENCOUNTER — Ambulatory Visit: Payer: 59 | Admitting: Family Medicine

## 2022-04-28 NOTE — Telephone Encounter (Signed)
Requested Prescriptions  Pending Prescriptions Disp Refills  . levothyroxine (SYNTHROID) 112 MCG tablet [Pharmacy Med Name: LEVOTHYROXINE 0.112MG  ( ) TABS] 90 tablet 0    Sig: TAKE 1 TABLET BY MOUTH DAILY BEFORE BREAKFAST     Endocrinology:  Hypothyroid Agents Failed - 04/27/2022  3:12 AM      Failed - TSH in normal range and within 360 days    TSH  Date Value Ref Range Status  01/15/2022 4.820 (H) 0.450 - 4.500 uIU/mL Final         Passed - Valid encounter within last 12 months    Recent Outpatient Visits          3 months ago Routine general medical examination at a health care facility   [facility], [place] P, DO   10 months ago Hypothyroidism, unspecified type   [facility], Megan P, DO   11 months ago Chronic fatigue   [facility] [place], Megan P, DO   1 year ago Type 2 diabetes mellitus with hyperglycemia, without long-term current use of insulin (HCC)   [facility] [place], Megan P, DO   1 year ago Gastroesophageal reflux disease, unspecified whether esophagitis present   [facility] [place], Oralia Rud, DO      Future Appointments            In 1 week Laural Benes, Oralia Rud, DO [facility]

## 2022-05-07 ENCOUNTER — Ambulatory Visit (INDEPENDENT_AMBULATORY_CARE_PROVIDER_SITE_OTHER): Payer: 59 | Admitting: Family Medicine

## 2022-05-07 ENCOUNTER — Encounter: Payer: Self-pay | Admitting: Family Medicine

## 2022-05-07 VITALS — BP 103/69 | HR 99 | Temp 98.1°F | Wt 212.0 lb

## 2022-05-07 DIAGNOSIS — E1165 Type 2 diabetes mellitus with hyperglycemia: Secondary | ICD-10-CM | POA: Diagnosis not present

## 2022-05-07 DIAGNOSIS — E039 Hypothyroidism, unspecified: Secondary | ICD-10-CM

## 2022-05-07 LAB — BAYER DCA HB A1C WAIVED: HB A1C (BAYER DCA - WAIVED): 6.2 % — ABNORMAL HIGH (ref 4.8–5.6)

## 2022-05-07 NOTE — Assessment & Plan Note (Signed)
Rechecking labs today. Await results. Treat as needed.  °

## 2022-05-07 NOTE — Progress Notes (Signed)
BP 103/69   Pulse 99   Temp 98.1 F (36.7 C)   Wt 212 lb (96.2 kg)   SpO2 96%   BMI 34.74 kg/m    Subjective:    Patient ID: Michael Gillespie, male    DOB: 1966/04/19, 56 y.o.   MRN: [id number]  HPI: Michael Gillespie is a 56 y.o. male  Chief Complaint  Patient presents with   Diabetes    Patient has not had eye exam this year    DIABETES Hypoglycemic episodes:no Polydipsia/polyuria: no Visual disturbance: no Chest pain: no Paresthesias: no Glucose Monitoring: no  Accucheck frequency: Not Checking Taking Insulin?: no Blood Pressure Monitoring: not checking Retinal Examination: Not up to Date Foot Exam: Up to Date Diabetic Education: Completed Pneumovax: Up to Date Influenza: Up to Date Aspirin: no  HYPOTHYROIDISM Thyroid control status:stable Satisfied with current treatment? yes Medication side effects: no Medication compliance: excellent compliance Recent dose adjustment:no Fatigue: yes Cold intolerance: no Heat intolerance: no Weight gain: no Weight loss: no Constipation: no Diarrhea/loose stools: no Palpitations: no Lower extremity edema: no Anxiety/depressed mood: yes  Relevant past medical, surgical, family and social history reviewed and updated as indicated. Interim medical history since our last visit reviewed. Allergies and medications reviewed and updated.  Review of Systems  Constitutional:  Positive for fatigue. Negative for activity change, appetite change, chills, diaphoresis, fever and unexpected weight change.  Respiratory: Negative.    Cardiovascular: Negative.   Musculoskeletal: Negative.   Skin: Negative.   Psychiatric/Behavioral: Negative.      Per HPI unless specifically indicated above     Objective:    BP 103/69   Pulse 99   Temp 98.1 F (36.7 C)   Wt 212 lb (96.2 kg)   SpO2 96%   BMI 34.74 kg/m   Wt Readings from Last 3 Encounters:  05/07/22 212 lb (96.2 kg)  01/15/22 210 lb (95.3 kg)  06/17/21 204 lb 3.2 oz  (92.6 kg)    Physical Exam Vitals and nursing note reviewed.  Constitutional:      General: He is not in acute distress.    Appearance: Normal appearance. He is obese. He is not ill-appearing, toxic-appearing or diaphoretic.  HENT:     Head: Normocephalic and atraumatic.     Right Ear: External ear normal.     Left Ear: External ear normal.     Nose: Nose normal.     Mouth/Throat:     Mouth: Mucous membranes are moist.     Pharynx: Oropharynx is clear.  Eyes:     General: No scleral icterus.       Right eye: No discharge.        Left eye: No discharge.     Extraocular Movements: Extraocular movements intact.     Conjunctiva/sclera: Conjunctivae normal.     Pupils: Pupils are equal, round, and reactive to light.  Cardiovascular:     Rate and Rhythm: Normal rate and regular rhythm.     Pulses: Normal pulses.     Heart sounds: Normal heart sounds. No murmur heard.    No friction rub. No gallop.  Pulmonary:     Effort: Pulmonary effort is normal. No respiratory distress.     Breath sounds: Normal breath sounds. No stridor. No wheezing, rhonchi or rales.  Chest:     Chest wall: No tenderness.  Musculoskeletal:        General: Normal range of motion.     Cervical back: Normal range of motion  and neck supple.  Skin:    General: Skin is warm and dry.     Capillary Refill: Capillary refill takes less than 2 seconds.     Coloration: Skin is not jaundiced or pale.     Findings: No bruising, erythema, lesion or rash.  Neurological:     General: No focal deficit present.     Mental Status: He is alert and oriented to person, place, and time. Mental status is at baseline.  Psychiatric:        Mood and Affect: Mood normal.        Behavior: Behavior normal.        Thought Content: Thought content normal.        Judgment: Judgment normal.     Results for orders placed or performed in visit on 05/07/22  Bayer DCA Hb A1c Waived  Result Value Ref Range   HB A1C (BAYER DCA - WAIVED)  6.2 (H) 4.8 - 5.6 %      Assessment & Plan:   Problem List Items Addressed This Visit       Endocrine   Hypothyroidism    Rechecking labs today. Await results. Treat as needed.      Relevant Orders   TSH   Type 2 diabetes mellitus with hyperglycemia (HCC) - Primary    Doing well with a1c of 6.2. Will continue 500mg  daily of metformin and recheck 3 months. Watch diet. Call with any concerns.       Relevant Orders   Bayer DCA Hb A1c Waived (Completed)     Follow up plan: Return in about 3 months (around 08/07/2022).

## 2022-05-07 NOTE — Assessment & Plan Note (Signed)
Doing well with a1c of 6.2. Will continue 500mg  daily of metformin and recheck 3 months. Watch diet. Call with any concerns.

## 2022-05-08 LAB — TSH: TSH: 4.7 u[IU]/mL — ABNORMAL HIGH (ref 0.450–4.500)

## 2022-05-10 ENCOUNTER — Other Ambulatory Visit: Payer: Self-pay | Admitting: Family Medicine

## 2022-05-10 DIAGNOSIS — E039 Hypothyroidism, unspecified: Secondary | ICD-10-CM

## 2022-05-10 MED ORDER — LEVOTHYROXINE SODIUM 125 MCG PO TABS: 125.0000 ug | ORAL_TABLET | Freq: Every day | ORAL | 1 refills | Status: DC

## 2022-05-11 NOTE — Telephone Encounter (Signed)
Requested medication (s) are due for refill today - no  Requested medication (s) are on the active medication list -yes  Future visit scheduled -yes  Last refill: 05/10/22 #30 1RF  Notes to clinic: pharmacy request 90 supply- sent for provider review -requires changes to last RF by office  Requested Prescriptions  Pending Prescriptions Disp Refills   levothyroxine (SYNTHROID) 125 MCG tablet [Pharmacy Med Name: LEVOTHYROXINE 0.125MG  ( ) TAB] 90 tablet     Sig: TAKE 1 TABLET(125 MCG) BY MOUTH DAILY BEFORE BREAKFAST     Endocrinology:  Hypothyroid Agents Failed - 05/10/2022  9:41 AM      Failed - TSH in normal range and within 360 days    TSH  Date Value Ref Range Status  05/07/2022 4.700 (H) 0.450 - 4.500 uIU/mL Final         Passed - Valid encounter within last 12 months    Recent Outpatient Visits           4 days ago Type 2 diabetes mellitus with hyperglycemia, without long-term current use of insulin (HCC)   [facility] [place], Megan P, DO   3 months ago Routine general medical examination at a health care facility   [facility], [place] P, DO   10 months ago Hypothyroidism, unspecified type   [facility], Megan P, DO   11 months ago Chronic fatigue   [facility] [place], Megan P, DO   1 year ago Type 2 diabetes mellitus with hyperglycemia, without long-term current use of insulin (HCC)   [facility] [place], [place], DO       Future Appointments             In 2 months [place], Megan P, DO [facility]               Requested Prescriptions  Pending Prescriptions Disp Refills   levothyroxine (SYNTHROID) 125 MCG tablet [Pharmacy Med Name: LEVOTHYROXINE 0.125MG  ( ) TAB] 90 tablet     Sig: TAKE 1 TABLET(125 MCG) BY MOUTH DAILY BEFORE BREAKFAST     Endocrinology:  Hypothyroid Agents Failed - 05/10/2022  9:41 AM      Failed - TSH in normal range and within  360 days    TSH  Date Value Ref Range Status  05/07/2022 4.700 (H) 0.450 - 4.500 uIU/mL Final         Passed - Valid encounter within last 12 months    Recent Outpatient Visits           4 days ago Type 2 diabetes mellitus with hyperglycemia, without long-term current use of insulin (HCC)   [facility] [place], Megan P, DO   3 months ago Routine general medical examination at a health care facility   [facility], [place] P, DO   10 months ago Hypothyroidism, unspecified type   [facility], Megan P, DO   11 months ago Chronic fatigue   [facility] [place], Megan P, DO   1 year ago Type 2 diabetes mellitus with hyperglycemia, without long-term current use of insulin [facility])   [facility] [place], [place], DO       Future Appointments             In 2 months Johnson, Oralia Rud, DO [facility], [facility]

## 2022-06-08 ENCOUNTER — Encounter: Payer: Self-pay | Admitting: Family Medicine

## 2022-06-09 ENCOUNTER — Ambulatory Visit
Admission: RE | Admit: 2022-06-09 | Discharge: 2022-06-09 | Disposition: A | Discharge status: Home / Self Care | Payer: 59 | Source: Ambulatory Visit | Attending: Family Medicine | Admitting: Family Medicine

## 2022-06-09 ENCOUNTER — Encounter: Payer: Self-pay | Admitting: Family Medicine

## 2022-06-09 ENCOUNTER — Ambulatory Visit (INDEPENDENT_AMBULATORY_CARE_PROVIDER_SITE_OTHER): Payer: 59 | Admitting: Family Medicine

## 2022-06-09 ENCOUNTER — Ambulatory Visit
Admission: RE | Admit: 2022-06-09 | Discharge: 2022-06-09 | Disposition: A | Discharge status: Home / Self Care | Payer: 59 | Attending: Family Medicine | Admitting: Family Medicine

## 2022-06-09 VITALS — BP 115/77 | HR 100 | Temp 98.1°F | Wt 212.8 lb

## 2022-06-09 DIAGNOSIS — M25562 Pain in left knee: Secondary | ICD-10-CM | POA: Insufficient documentation

## 2022-06-09 MED ORDER — KETOROLAC TROMETHAMINE 60 MG/2ML IM SOLN: 60.0000 mg | Freq: Once | INTRAMUSCULAR | Status: AC

## 2022-06-09 MED ORDER — NAPROXEN 500 MG PO TABS: 500.0000 mg | ORAL_TABLET | Freq: Two times a day (BID) | ORAL | 3 refills | Status: DC

## 2022-06-09 NOTE — Progress Notes (Signed)
BP 115/77   Pulse 100   Temp 98.1 F (36.7 C)   Wt 212 lb 12.8 oz (96.5 kg)   SpO2 98%   BMI 34.87 kg/m    Subjective:    Patient ID: Michael Gillespie, male    DOB: 1966/08/04, 56 y.o.   MRN: [id number]  HPI: Michael Gillespie is a 56 y.o. male  Chief Complaint  Patient presents with   Knee Pain    Patient states he injured his left knee on Sunday, since then pain has got worse. Patient states knee is swollen.    KNEE PAIN Duration: 2.5 days Involved knee: left Mechanism of injury: got pulled by the dog Location:diffuse Onset: sudden Severity: severe  Quality:  throbbing, sharp with bending Frequency: constant Radiation: no Aggravating factors: weight bearing, bending, and movement  Alleviating factors: elevation and advil  Status: worse Treatments attempted: rest and ibuprofen  Relief with NSAIDs?:  mild Weakness with weight bearing or walking: no Sensation of giving way: no Locking: no Popping: no Bruising: no Swelling: yes Redness: no Paresthesias/decreased sensation: no Fevers: no  Relevant past medical, surgical, family and social history reviewed and updated as indicated. Interim medical history since our last visit reviewed. Allergies and medications reviewed and updated.  Review of Systems  Constitutional: Negative.   Respiratory: Negative.    Cardiovascular: Negative.   Gastrointestinal: Negative.   Musculoskeletal:  Positive for arthralgias, gait problem and joint swelling. Negative for back pain, myalgias, neck pain and neck stiffness.  Skin: Negative.   Psychiatric/Behavioral: Negative.      Per HPI unless specifically indicated above     Objective:    BP 115/77   Pulse 100   Temp 98.1 F (36.7 C)   Wt 212 lb 12.8 oz (96.5 kg)   SpO2 98%   BMI 34.87 kg/m   Wt Readings from Last 3 Encounters:  06/09/22 212 lb 12.8 oz (96.5 kg)  05/07/22 212 lb (96.2 kg)  01/15/22 210 lb (95.3 kg)    Physical Exam Vitals and nursing note  reviewed.  Constitutional:      General: He is not in acute distress.    Appearance: Normal appearance. He is well-developed.  HENT:     Head: Normocephalic and atraumatic.     Right Ear: Hearing and external ear normal.     Left Ear: Hearing and external ear normal.     Nose: Nose normal.     Mouth/Throat:     Mouth: Mucous membranes are moist.     Pharynx: Oropharynx is clear.  Eyes:     General: Lids are normal. No scleral icterus.       Right eye: No discharge.        Left eye: No discharge.     Conjunctiva/sclera: Conjunctivae normal.  Pulmonary:     Effort: Pulmonary effort is normal. No respiratory distress.  Musculoskeletal:        General: Swelling and tenderness present. No deformity or signs of injury.     Right lower leg: No edema.     Left lower leg: No edema.     Comments: L knee joint effusion. Negative anterior and posterior drawer, negative mcmurray, negative varus/valgus  Skin:    Coloration: Skin is not jaundiced or pale.     Findings: No bruising, erythema, lesion or rash.  Neurological:     Mental Status: He is alert. Mental status is at baseline. He is disoriented.  Psychiatric:  Mood and Affect: Mood normal.        Speech: Speech normal.        Behavior: Behavior normal.        Thought Content: Thought content normal.        Judgment: Judgment normal.     Results for orders placed or performed in visit on 05/07/22  Bayer DCA Hb A1c Waived  Result Value Ref Range   HB A1C (BAYER DCA - WAIVED) 6.2 (H) 4.8 - 5.6 %  TSH  Result Value Ref Range   TSH 4.700 (H) 0.450 - 4.500 uIU/mL      Assessment & Plan:   Problem List Items Addressed This Visit   None Visit Diagnoses     Acute pain of left knee    -  Primary   Will treat with RICE and naproxen. Obtain x-ray. Recheck next week- if not better or worsening consider referral to ortho.   Relevant Medications   ketorolac (TORADOL) injection 60 mg (Start on 06/09/2022  9:45 AM)   Other  Relevant Orders   DG Knee Complete 4 Views Left        Follow up plan: Return Check in on Tuesday.

## 2022-06-10 ENCOUNTER — Other Ambulatory Visit: Payer: Self-pay | Admitting: Family Medicine

## 2022-06-10 DIAGNOSIS — E039 Hypothyroidism, unspecified: Secondary | ICD-10-CM

## 2022-06-10 NOTE — Telephone Encounter (Signed)
Requested Prescriptions  Pending Prescriptions Disp Refills  . levothyroxine (SYNTHROID) 125 MCG tablet [Pharmacy Med Name: LEVOTHYROXINE 0.125MG  ( ) TAB] 30 tablet 0    Sig: TAKE 1 TABLET(125 MCG) BY MOUTH DAILY BEFORE BREAKFAST     Endocrinology:  Hypothyroid Agents Failed - 06/10/2022  3:11 AM      Failed - TSH in normal range and within 360 days    TSH  Date Value Ref Range Status  05/07/2022 4.700 (H) 0.450 - 4.500 uIU/mL Final         Passed - Valid encounter within last 12 months    Recent Outpatient Visits          Yesterday Acute pain of left knee   [facility], Megan P, DO   1 month ago Type 2 diabetes mellitus with hyperglycemia, without long-term current use of insulin (HCC)   [facility] [place], Megan P, DO   4 months ago Routine general medical examination at a health care facility   [facility], Megan P, DO   11 months ago Hypothyroidism, unspecified type   [facility] [place], [place], DO   1 year ago Chronic fatigue   [facility] [place], [place], DO      Future Appointments            In 1 month Johnson, Oralia Rud, DO [facility]

## 2022-06-11 ENCOUNTER — Telehealth: Payer: Self-pay | Admitting: Family Medicine

## 2022-06-11 NOTE — Telephone Encounter (Signed)
PT dropped off FMLA paperwork to be filled out by provider.  Stated that provider was aware of the paperwork being dropped off.  Put in provider's folder.  Call when forms are complete.

## 2022-06-18 ENCOUNTER — Other Ambulatory Visit: Payer: 59

## 2022-06-18 DIAGNOSIS — E039 Hypothyroidism, unspecified: Secondary | ICD-10-CM

## 2022-06-19 LAB — TSH: TSH: 6.26 u[IU]/mL — ABNORMAL HIGH (ref 0.450–4.500)

## 2022-06-20 ENCOUNTER — Other Ambulatory Visit: Payer: Self-pay | Admitting: Family Medicine

## 2022-06-20 DIAGNOSIS — E039 Hypothyroidism, unspecified: Secondary | ICD-10-CM

## 2022-06-20 MED ORDER — LEVOTHYROXINE SODIUM 150 MCG PO TABS: 150.0000 ug | ORAL_TABLET | Freq: Every day | ORAL | 1 refills | Status: DC

## 2022-06-22 NOTE — Telephone Encounter (Signed)
Requested medications are due for refill today.  no  Requested medications are on the active medications list.  yes  Last refill. 06/20/2022 #30 1 refill  Future visit scheduled.   yes  Notes to clinic.  Pt is requesting a 90 day supply.      Requested Prescriptions  Pending Prescriptions Disp Refills   levothyroxine (SYNTHROID) 150 MCG tablet [Pharmacy Med Name: LEVOTHYROXINE 0.150MG  ( ) TAB] 90 tablet     Sig: TAKE 1 TABLET(150 MCG) BY MOUTH DAILY BEFORE BREAKFAST     Endocrinology:  Hypothyroid Agents Failed - 06/20/2022  9:23 PM      Failed - TSH in normal range and within 360 days    TSH  Date Value Ref Range Status  06/18/2022 6.260 (H) 0.450 - 4.500 uIU/mL Final         Passed - Valid encounter within last 12 months    Recent Outpatient Visits           1 week ago Acute pain of left knee   [facility] [place], Megan P, DO   1 month ago Type 2 diabetes mellitus with hyperglycemia, without long-term current use of insulin (HCC)   [facility] [place], Megan P, DO   5 months ago Routine general medical examination at a health care facility   [facility] [place], [place] P, DO   1 year ago Hypothyroidism, unspecified type   [facility] [place], [place], DO   1 year ago Chronic fatigue   [facility] [place], [place], DO       Future Appointments             In 3 weeks [place], Oralia Rud, DO [facility], [facility]

## 2022-07-06 ENCOUNTER — Encounter: Payer: Self-pay | Admitting: Family Medicine

## 2022-07-06 NOTE — Telephone Encounter (Signed)
appt

## 2022-07-07 NOTE — Telephone Encounter (Signed)
Pt scheduled  

## 2022-07-09 ENCOUNTER — Encounter: Payer: Self-pay | Admitting: Family Medicine

## 2022-07-09 ENCOUNTER — Ambulatory Visit (INDEPENDENT_AMBULATORY_CARE_PROVIDER_SITE_OTHER): Payer: 59 | Admitting: Family Medicine

## 2022-07-09 VITALS — BP 109/75 | HR 103 | Temp 98.4°F | Ht 65.5 in | Wt 212.8 lb

## 2022-07-09 DIAGNOSIS — E1165 Type 2 diabetes mellitus with hyperglycemia: Secondary | ICD-10-CM | POA: Diagnosis not present

## 2022-07-09 DIAGNOSIS — E7800 Pure hypercholesterolemia, unspecified: Secondary | ICD-10-CM | POA: Diagnosis not present

## 2022-07-09 DIAGNOSIS — R000 Tachycardia, unspecified: Secondary | ICD-10-CM | POA: Diagnosis not present

## 2022-07-09 DIAGNOSIS — F339 Major depressive disorder, recurrent, unspecified: Secondary | ICD-10-CM

## 2022-07-09 DIAGNOSIS — F419 Anxiety disorder, unspecified: Secondary | ICD-10-CM

## 2022-07-09 DIAGNOSIS — E039 Hypothyroidism, unspecified: Secondary | ICD-10-CM

## 2022-07-09 LAB — BAYER DCA HB A1C WAIVED: HB A1C (BAYER DCA - WAIVED): 6.6 % — ABNORMAL HIGH (ref 4.8–5.6)

## 2022-07-09 MED ORDER — EZETIMIBE 10 MG PO TABS: 10.0000 mg | ORAL_TABLET | Freq: Every day | ORAL | 1 refills | Status: DC

## 2022-07-09 MED ORDER — FLUOXETINE HCL 20 MG PO CAPS: 20.0000 mg | ORAL_CAPSULE | Freq: Two times a day (BID) | ORAL | 3 refills | Status: DC

## 2022-07-09 MED ORDER — METFORMIN HCL 500 MG PO TABS: ORAL_TABLET | ORAL | 1 refills | Status: DC

## 2022-07-09 NOTE — Assessment & Plan Note (Signed)
Will increase his fluoxetine and recheck 1 month. Call with any concerns.

## 2022-07-09 NOTE — Assessment & Plan Note (Signed)
Early for recheck, but given tachycardia, will check today. Await results. Treat as needed.

## 2022-07-09 NOTE — Assessment & Plan Note (Signed)
Under good control on current regimen. Continue current regimen. Continue to monitor. Call with any concerns. Refills given. Labs drawn today.   

## 2022-07-09 NOTE — Assessment & Plan Note (Signed)
Will increase his fluoxetine and recheck 1 month. Call with any concerns.  

## 2022-07-09 NOTE — Progress Notes (Signed)
BP 109/75   Pulse (!) 103   Temp 98.4 F (36.9 C) (Oral)   Ht 5' 5.5" (1.664 m)   Wt 212 lb 12.8 oz (96.5 kg)   SpO2 97%   BMI 34.87 kg/m    Subjective:    Patient ID: Michael Gillespie, male    DOB: [birth year], 56 y.o.   MRN: [id number]  HPI: Michael Gillespie is a 56 y.o. male  Chief Complaint  Patient presents with   Heart Problem    Patient is here for Heart Concerns. Patient says Monday or Tuesday, he noticed his heart rate was elevated and says all week it has been averaging anywhere from 58 to 115. Patient says he has been checking his heart rate according to a smart watch, but patient denies having heart palpitations. Patient says he is not sure if it is related to his Thyroid as his medication was recently changed.     PALPITATIONS- smart watch has been telling him that his HR is up. Feeling fine Duration: couple of days Symptom description: asymptomatic Duration of episode:  unsure Frequency: recurrent Activity when event occurred: no particular Related to exertion: no Dyspnea: no Chest pain: no Syncope: no Anxiety/stress: yes Nausea/vomiting: no Diaphoresis: no Coronary artery disease: no Congestive heart failure: no Arrhythmia:no Thyroid disease: yes Caffeine intake: none Status:  fluctuating Treatments attempted:none  DIABETES Hypoglycemic episodes:no Polydipsia/polyuria: no Visual disturbance: no Chest pain: no Paresthesias: no Glucose Monitoring: no Taking Insulin?: no Blood Pressure Monitoring: rarely Retinal Examination: Not up to Date Foot Exam: Up to Date Diabetic Education: Completed Pneumovax: Up to Date Influenza:  declined Aspirin: no  ANXIETY/STRESS Duration:exacerbated Anxious mood: yes  Excessive worrying: yes Irritability: no  Sweating: no Nausea: no Palpitations:yes Hyperventilation: no Panic attacks: no Agoraphobia: no  Obscessions/compulsions: no Depressed mood: no    07/09/2022    2:38 PM 05/07/2022    3:38 PM  01/15/2022    8:48 AM 08/21/2021    1:37 PM 06/17/2021    8:43 AM  Depression screen PHQ 2/9  Decreased Interest 0 1 0 0 0  Down, Depressed, Hopeless 1 1 0 0 0  PHQ - 2 Score 1 2 0 0 0  Altered sleeping 0 1 0  0  Tired, decreased energy 1 1 1  1   Change in appetite 0 1 0  0  Feeling bad or failure about yourself  0 0 0  0  Trouble concentrating 0 0 0  0  Moving slowly or fidgety/restless 0 0 0  0  Suicidal thoughts 0 0 0  0  PHQ-9 Score 2 5 1  1   Difficult doing work/chores Not difficult at all Not difficult at all         07/09/2022    2:38 PM 05/07/2022    3:38 PM 01/15/2022    8:48 AM 06/17/2021    8:44 AM  GAD 7 : Generalized Anxiety Score  Nervous, Anxious, on Edge 2 0 0 0  Control/stop worrying 2 0 0 0  Worry too much - different things 2 0 0 0  Trouble relaxing 0 0 0 0  Restless 0 0 0 0  Easily annoyed or irritable 0 0 0 0  Afraid - awful might happen 1 0 0 0  Total GAD 7 Score 7 0 0 0  Anxiety Difficulty Not difficult at all Not difficult at all Not difficult at all Not difficult at all     Anhedonia: no Weight changes: no Insomnia: no  Hypersomnia: no Fatigue/loss of energy: no Feelings of worthlessness: no Feelings of guilt: no Impaired concentration/indecisiveness: no Suicidal ideations: no  Crying spells: no Recent Stressors/Life Changes: no   Relationship problems: no   Family stress: no     Financial stress: no    Job stress: no    Recent death/loss: no    Relevant past medical, surgical, family and social history reviewed and updated as indicated. Interim medical history since our last visit reviewed. Allergies and medications reviewed and updated.  Review of Systems  Constitutional: Negative.   Respiratory: Negative.    Cardiovascular: Negative.   Gastrointestinal: Negative.   Musculoskeletal: Negative.   Psychiatric/Behavioral:  Positive for dysphoric mood. Negative for agitation, behavioral problems, confusion, decreased concentration,  hallucinations, self-injury, sleep disturbance and suicidal ideas. The patient is nervous/anxious. The patient is not hyperactive.     Per HPI unless specifically indicated above     Objective:    BP 109/75   Pulse (!) 103   Temp 98.4 F (36.9 C) (Oral)   Ht 5' 5.5" (1.664 m)   Wt 212 lb 12.8 oz (96.5 kg)   SpO2 97%   BMI 34.87 kg/m   Wt Readings from Last 3 Encounters:  07/09/22 212 lb 12.8 oz (96.5 kg)  06/09/22 212 lb 12.8 oz (96.5 kg)  05/07/22 212 lb (96.2 kg)    Physical Exam Vitals and nursing note reviewed.  Constitutional:      General: He is not in acute distress.    Appearance: Normal appearance. He is not ill-appearing, toxic-appearing or diaphoretic.  HENT:     Head: Normocephalic and atraumatic.     Right Ear: External ear normal.     Left Ear: External ear normal.     Nose: Nose normal.     Mouth/Throat:     Mouth: Mucous membranes are moist.     Pharynx: Oropharynx is clear.  Eyes:     General: No scleral icterus.       Right eye: No discharge.        Left eye: No discharge.     Extraocular Movements: Extraocular movements intact.     Conjunctiva/sclera: Conjunctivae normal.     Pupils: Pupils are equal, round, and reactive to light.  Cardiovascular:     Rate and Rhythm: Normal rate and regular rhythm.     Pulses: Normal pulses.     Heart sounds: Normal heart sounds. No murmur heard.    No friction rub. No gallop.  Pulmonary:     Effort: Pulmonary effort is normal. No respiratory distress.     Breath sounds: Normal breath sounds. No stridor. No wheezing, rhonchi or rales.  Chest:     Chest wall: No tenderness.  Musculoskeletal:        General: Normal range of motion.     Cervical back: Normal range of motion and neck supple.  Skin:    General: Skin is warm and dry.     Capillary Refill: Capillary refill takes less than 2 seconds.     Coloration: Skin is not jaundiced or pale.     Findings: No bruising, erythema, lesion or rash.   Neurological:     General: No focal deficit present.     Mental Status: He is alert and oriented to person, place, and time. Mental status is at baseline.  Psychiatric:        Mood and Affect: Mood normal.        Behavior: Behavior normal.  Thought Content: Thought content normal.        Judgment: Judgment normal.     Results for orders placed or performed in visit on 06/18/22  TSH  Result Value Ref Range   TSH 6.260 (H) 0.450 - 4.500 uIU/mL      Assessment & Plan:   Problem List Items Addressed This Visit       Endocrine   Hypothyroidism    Early for recheck, but given tachycardia, will check today. Await results. Treat as needed.       Relevant Orders   TSH   Type 2 diabetes mellitus with hyperglycemia (HCC)    Doing well with A1c of 6.6. Continue to monitor. Call with any concerns.       Relevant Medications   metFORMIN (GLUCOPHAGE) 500 MG tablet   Other Relevant Orders   Bayer DCA Hb A1c Waived   Comprehensive metabolic panel     Other   Hyperlipidemia    Under good control on current regimen. Continue current regimen. Continue to monitor. Call with any concerns. Refills given. Labs drawn today.       Relevant Medications   ezetimibe (ZETIA) 10 MG tablet   Other Relevant Orders   Lipid Panel w/o Chol/HDL Ratio   Comprehensive metabolic panel   Anxiety    Will increase his fluoxetine and recheck 1 month. Call with any concerns.       Relevant Medications   FLUoxetine (PROZAC) 20 MG capsule   Other Relevant Orders   CBC with Differential/Platelet   Depression, recurrent (HCC)    Will increase his fluoxetine and recheck 1 month. Call with any concerns.       Relevant Medications   FLUoxetine (PROZAC) 20 MG capsule   Other Visit Diagnoses     Tachycardia    -  Primary   Likely multifactorial. Will check labs and increase anxiety medicine. Recheck 1 month- if still acting up consider Holter. Call with any concerns.    Relevant Orders   EKG  12-Lead (Completed)   CBC with Differential/Platelet        Follow up plan: Return in about 4 weeks (around 08/06/2022) for OK to cancel appt next week.

## 2022-07-09 NOTE — Assessment & Plan Note (Signed)
Doing well with A1c of 6.6. Continue to monitor. Call with any concerns.

## 2022-07-10 LAB — LIPID PANEL W/O CHOL/HDL RATIO
Cholesterol, Total: 178 mg/dL (ref 100–199)
HDL: 34 mg/dL — ABNORMAL LOW (ref >39)
LDL Chol Calc (NIH): 94 mg/dL (ref 0–99)
Triglycerides: 297 mg/dL — ABNORMAL HIGH (ref 0–149)
VLDL Cholesterol Cal: 50 mg/dL — ABNORMAL HIGH (ref 5–40)

## 2022-07-10 LAB — CBC WITH DIFFERENTIAL/PLATELET
Basophils Absolute: 0 10*3/uL (ref 0.0–0.2)
Basos: 1 %
EOS (ABSOLUTE): 0.4 10*3/uL (ref 0.0–0.4)
Eos: 6 %
Hematocrit: 41.6 % (ref 37.5–51.0)
Hemoglobin: 14.3 g/dL (ref 13.0–17.7)
Immature Grans (Abs): 0.1 10*3/uL (ref 0.0–0.1)
Immature Granulocytes: 1 %
Lymphocytes Absolute: 2.3 10*3/uL (ref 0.7–3.1)
Lymphs: 33 %
MCH: 30.8 pg (ref 26.6–33.0)
MCHC: 34.4 g/dL (ref 31.5–35.7)
MCV: 90 fL (ref 79–97)
Monocytes Absolute: 0.7 10*3/uL (ref 0.1–0.9)
Monocytes: 9 %
Neutrophils Absolute: 3.5 10*3/uL (ref 1.4–7.0)
Neutrophils: 50 %
Platelets: 243 10*3/uL (ref 150–450)
RBC: 4.65 x10E6/uL (ref 4.14–5.80)
RDW: 13.1 % (ref 11.6–15.4)
WBC: 7 10*3/uL (ref 3.4–10.8)

## 2022-07-10 LAB — COMPREHENSIVE METABOLIC PANEL
ALT: 66 IU/L — ABNORMAL HIGH (ref 0–44)
AST: 47 IU/L — ABNORMAL HIGH (ref 0–40)
Albumin/Globulin Ratio: 1.8 (ref 1.2–2.2)
Albumin: 4.9 g/dL (ref 3.8–4.9)
Alkaline Phosphatase: 65 IU/L (ref 44–121)
BUN/Creatinine Ratio: 13 (ref 9–20)
BUN: 15 mg/dL (ref 6–24)
Bilirubin Total: 0.3 mg/dL (ref 0.0–1.2)
CO2: 21 mmol/L (ref 20–29)
Calcium: 9.7 mg/dL (ref 8.7–10.2)
Chloride: 102 mmol/L (ref 96–106)
Creatinine, Ser: 1.14 mg/dL (ref 0.76–1.27)
Globulin, Total: 2.8 g/dL (ref 1.5–4.5)
Glucose: 88 mg/dL (ref 70–99)
Potassium: 4 mmol/L (ref 3.5–5.2)
Sodium: 141 mmol/L (ref 134–144)
Total Protein: 7.7 g/dL (ref 6.0–8.5)
eGFR: 76 mL/min/{1.73_m2} (ref >59)

## 2022-07-10 LAB — TSH: TSH: 0.211 u[IU]/mL — ABNORMAL LOW (ref 0.450–4.500)

## 2022-07-11 ENCOUNTER — Other Ambulatory Visit: Payer: Self-pay | Admitting: Family Medicine

## 2022-07-12 ENCOUNTER — Other Ambulatory Visit: Payer: Self-pay | Admitting: Family Medicine

## 2022-07-12 DIAGNOSIS — E039 Hypothyroidism, unspecified: Secondary | ICD-10-CM

## 2022-07-12 MED ORDER — LEVOTHYROXINE SODIUM 125 MCG PO TABS: 125.0000 ug | ORAL_TABLET | Freq: Every day | ORAL | 0 refills | Status: DC

## 2022-07-12 NOTE — Telephone Encounter (Signed)
Refilled 07/09/2022 #60 3 rf. Requested Prescriptions  Pending Prescriptions Disp Refills  . FLUoxetine (PROZAC) 20 MG tablet [Pharmacy Med Name: FLUOXETINE 20MG  TABLETS] 90 tablet 1    Sig: TAKE 1 TABLET(20 MG) BY MOUTH DAILY     Psychiatry:  Antidepressants - SSRI Passed - 07/11/2022  9:56 AM      Passed - Completed PHQ-2 or PHQ-9 in the last 360 days      Passed - Valid encounter within last 6 months    Recent Outpatient Visits          3 days ago Tachycardia   [place], Megan P, DO   1 month ago Acute pain of left knee   [facility] [place], Megan P, DO   2 months ago Type 2 diabetes mellitus with hyperglycemia, without long-term current use of insulin ([place])   [place], Megan P, DO   5 months ago Routine general medical examination at a health care facility   [place], [place], DO   1 year ago Hypothyroidism, unspecified type   [facility] Valerie Roys, DO      Future Appointments            In 1 month Johnson, Barb Merino, DO [place], [place]

## 2022-07-16 ENCOUNTER — Ambulatory Visit: Payer: 59 | Admitting: Family Medicine

## 2022-07-18 ENCOUNTER — Other Ambulatory Visit: Payer: Self-pay | Admitting: Family Medicine

## 2022-07-18 DIAGNOSIS — E039 Hypothyroidism, unspecified: Secondary | ICD-10-CM

## 2022-07-19 NOTE — Telephone Encounter (Signed)
Requested by interface surescripts. Medication dose discontinued 07/12/22. Requested Prescriptions  Refused Prescriptions Disp Refills  . levothyroxine (SYNTHROID) 150 MCG tablet [Pharmacy Med Name: LEVOTHYROXINE 0.150MG  (150MCG) TAB] 90 tablet     Sig: TAKE 1 TABLET(150 MCG) BY MOUTH DAILY BEFORE BREAKFAST     Endocrinology:  Hypothyroid Agents Failed - 07/18/2022 12:02 PM      Failed - TSH in normal range and within 360 days    TSH  Date Value Ref Range Status  07/09/2022 0.211 (L) 0.450 - 4.500 uIU/mL Final         Passed - Valid encounter within last 12 months    Recent Outpatient Visits          1 week ago Tachycardia   [place], Megan P, DO   1 month ago Acute pain of left knee   [facility] [place], Megan P, DO   2 months ago Type 2 diabetes mellitus with hyperglycemia, without long-term current use of insulin ([place])   [place], Megan P, DO   6 months ago Routine general medical examination at a health care facility   [place], [place], DO   1 year ago Hypothyroidism, unspecified type   [facility] Valerie Roys, DO      Future Appointments            In 3 weeks Wyn[place] Emery, Barb Merino, DO [facility]

## 2022-07-22 NOTE — Progress Notes (Signed)
Interpreted by me 07/09/22. NSR at 93bpm. Poor baseline.

## 2022-07-24 ENCOUNTER — Other Ambulatory Visit: Payer: Self-pay | Admitting: Family Medicine

## 2022-07-26 NOTE — Telephone Encounter (Signed)
Requested medication (s) are due for refill today: yes  Requested medication (s) are on the active medication list:yes  Last refill:  03/04/22 #10 with 0 RF  Future visit scheduled: 08/13/22, seen 07/09/22  Notes to clinic:  This medication can not be delegated, please assess.        Requested Prescriptions  Pending Prescriptions Disp Refills   LORazepam (ATIVAN) 1 MG tablet [Pharmacy Med Name: LORAZEPAM 1MG  TABLETS] 10 tablet     Sig: TAKE 1 TABLET(1 MG) BY MOUTH DAILY AS NEEDED FOR ANXIETY     Not Delegated - Psychiatry: Anxiolytics/Hypnotics 2 Failed - 07/24/2022  9:41 PM      Failed - This refill cannot be delegated      Failed - Urine Drug Screen completed in last 360 days      Passed - Patient is not pregnant      Passed - Valid encounter within last 6 months    Recent Outpatient Visits           2 weeks ago Tachycardia   [place], Megan P, DO   1 month ago Acute pain of left knee   [place], Megan P, DO   2 months ago Type 2 diabetes mellitus with hyperglycemia, without long-term current use of insulin ([place])   [place], Megan P, DO   6 months ago Routine general medical examination at a health care facility   [place], [place], DO   1 year ago Hypothyroidism, unspecified type   [facility] Valerie Roys, DO       Future Appointments             In 2 weeks Wynetta Emery, Barb Merino, DO [facility], [facility]

## 2022-08-06 ENCOUNTER — Other Ambulatory Visit: Payer: 59

## 2022-08-13 ENCOUNTER — Ambulatory Visit: Payer: 59 | Admitting: Family Medicine

## 2022-11-01 LAB — HM DIABETES EYE EXAM

## 2022-11-18 ENCOUNTER — Other Ambulatory Visit: Payer: 59

## 2022-11-18 DIAGNOSIS — E039 Hypothyroidism, unspecified: Secondary | ICD-10-CM

## 2022-11-19 ENCOUNTER — Other Ambulatory Visit: Payer: Self-pay | Admitting: Family Medicine

## 2022-11-19 DIAGNOSIS — E039 Hypothyroidism, unspecified: Secondary | ICD-10-CM

## 2022-11-19 LAB — TSH: TSH: 2.09 u[IU]/mL (ref 0.450–4.500)

## 2022-11-19 MED ORDER — LEVOTHYROXINE SODIUM 125 MCG PO TABS: 125.0000 ug | ORAL_TABLET | Freq: Every day | ORAL | 3 refills | Status: DC

## 2023-03-26 ENCOUNTER — Other Ambulatory Visit: Payer: Self-pay | Admitting: Family Medicine

## 2023-03-28 ENCOUNTER — Other Ambulatory Visit: Payer: Self-pay | Admitting: Family Medicine

## 2023-03-28 NOTE — Telephone Encounter (Signed)
Called and scheduled patient for an appointment on 04/01/2023 @ 3:40 pm.  Sending to provider.

## 2023-03-28 NOTE — Telephone Encounter (Signed)
Patient is overdue for an appointment. Please call to schedule and then route to provider for refill.  

## 2023-03-28 NOTE — Telephone Encounter (Signed)
Does he have enough to make it to his appointment on Friday?

## 2023-03-28 NOTE — Telephone Encounter (Signed)
Requested medication (s) are due for refill today: yes  Requested medication (s) are on the active medication list: yes  Last refill:  08/03/22  Future visit scheduled: no  Notes to clinic:  Unable to refill per protocol, cannot delegate.      Requested Prescriptions  Pending Prescriptions Disp Refills   LORazepam (ATIVAN) 1 MG tablet [Pharmacy Med Name: LORAZEPAM 1MG  TABLETS] 10 tablet     Sig: TAKE 1 TABLET(1 MG) BY MOUTH DAILY AS NEEDED FOR ANXIETY     Not Delegated - Psychiatry: Anxiolytics/Hypnotics 2 Failed - 03/26/2023 12:32 PM      Failed - This refill cannot be delegated      Failed - Urine Drug Screen completed in last 360 days      Failed - Valid encounter within last 6 months    Recent Outpatient Visits           8 months ago Tachycardia   [place] [facility] [place], Megan P, DO   9 months ago Acute pain of left knee   [facility], [facility] P, DO   10 months ago Type 2 diabetes mellitus with hyperglycemia, without long-term current use of insulin (HCC)   [facility] [place], Megan P, DO   1 year ago Routine general medical examination at a health care facility   [facility] [place], [place] P, DO   1 year ago Hypothyroidism, unspecified type    [facility] [place], [place], DO              Passed - Patient is not pregnant      Signed Prescriptions Disp Refills   ezetimibe (ZETIA) 10 MG tablet 30 tablet 0    Sig: TAKE 1 TABLET(10 MG) BY MOUTH DAILY     Cardiovascular:  Antilipid - Sterol Transport Inhibitors Failed - 03/26/2023 12:32 PM      Failed - AST in normal range and within 360 days    AST  Date Value Ref Range Status  07/09/2022 47 (H) 0 - 40 IU/L Final   AST (SGOT) Piccolo, Waived  Date Value Ref Range Status  07/12/2018 40 (H) 11 - 38 U/L Final         Failed - ALT in normal range and within 360 days    ALT  Date  Value Ref Range Status  07/09/2022 66 (H) 0 - 44 IU/L Final   ALT (SGPT) Piccolo, Waived  Date Value Ref Range Status  07/12/2018 61 (H) 10 - 47 U/L Final         Failed - Lipid Panel in normal range within the last 12 months    Cholesterol, Total  Date Value Ref Range Status  07/09/2022 178 100 - 199 mg/dL Final   Cholesterol Piccolo, Waived  Date Value Ref Range Status  07/12/2018 214 (H) <200 mg/dL Final    Comment:                            Desirable                <200                         Borderline High      200- 239  High                     >239    LDL Chol Calc (NIH)  Date Value Ref Range Status  07/09/2022 94 0 - 99 mg/dL Final   HDL  Date Value Ref Range Status  07/09/2022 34 (L) >39 mg/dL Final   Triglycerides  Date Value Ref Range Status  07/09/2022 297 (H) 0 - 149 mg/dL Final   Triglycerides Piccolo,Waived  Date Value Ref Range Status  07/12/2018 298 (H) <150 mg/dL Final    Comment:                            Normal                   <150                         Borderline High     150 - 199                         High                200 - 499                         Very High                >499          Passed - Patient is not pregnant      Passed - Valid encounter within last 12 months    Recent Outpatient Visits           8 months ago Tachycardia   [place] [facility] [place], Megan P, DO   9 months ago Acute pain of left knee   [place] [facility], Megan P, DO   10 months ago Type 2 diabetes mellitus with hyperglycemia, without long-term current use of insulin (HCC)   [place] [facility] [place], Megan P, DO   1 year ago Routine general medical examination at a health care facility   [facility] [place], [place] P, DO   1 year ago Hypothyroidism, unspecified type   [place] [facility], Megan P, DO                FLUoxetine (PROZAC) 20 MG capsule 60 capsule 0    Sig: TAKE 1 CAPSULE(20 MG) BY MOUTH TWICE DAILY     Psychiatry:  Antidepressants - SSRI Failed - 03/26/2023 12:32 PM      Failed - Valid encounter within last 6 months    Recent Outpatient Visits           8 months ago Tachycardia   [facility] [place], Megan P, DO   9 months ago Acute pain of left knee   [place] [facility], Megan P, DO   10 months ago Type 2 diabetes mellitus with hyperglycemia, without long-term current use of insulin (HCC)   [facility] [place], Megan P, DO   1 year ago Routine general medical examination at a health care facility   [facility] [place], [place] P, DO   1 year ago Hypothyroidism, unspecified type   [place] [facility] [place],  Megan P, DO              Passed - Completed PHQ-2 or PHQ-9 in the last 360 days

## 2023-03-28 NOTE — Telephone Encounter (Signed)
Patient says he does have enough to get him to his scheduled appointment on Friday with provider.

## 2023-03-28 NOTE — Telephone Encounter (Signed)
Patient needs OV for additional refills, will refill for 30 days until can be made.  Requested Prescriptions  Pending Prescriptions Disp Refills   ezetimibe (ZETIA) 10 MG tablet [Pharmacy Med Name: EZETIMIBE 10MG  TABLETS] 30 tablet 0    Sig: TAKE 1 TABLET(10 MG) BY MOUTH DAILY     Cardiovascular:  Antilipid - Sterol Transport Inhibitors Failed - 03/26/2023 12:32 PM      Failed - AST in normal range and within 360 days    AST  Date Value Ref Range Status  07/09/2022 47 (H) 0 - 40 IU/L Final   AST (SGOT) Piccolo, Waived  Date Value Ref Range Status  07/12/2018 40 (H) 11 - 38 U/L Final         Failed - ALT in normal range and within 360 days    ALT  Date Value Ref Range Status  07/09/2022 66 (H) 0 - 44 IU/L Final   ALT (SGPT) Piccolo, Waived  Date Value Ref Range Status  07/12/2018 61 (H) 10 - 47 U/L Final         Failed - Lipid Panel in normal range within the last 12 months    Cholesterol, Total  Date Value Ref Range Status  07/09/2022 178 100 - 199 mg/dL Final   Cholesterol Piccolo, Waived  Date Value Ref Range Status  07/12/2018 214 (H) <200 mg/dL Final    Comment:                            Desirable                <200                         Borderline High      200- 239                         High                     >239    LDL Chol Calc (NIH)  Date Value Ref Range Status  07/09/2022 94 0 - 99 mg/dL Final   HDL  Date Value Ref Range Status  07/09/2022 34 (L) >39 mg/dL Final   Triglycerides  Date Value Ref Range Status  07/09/2022 297 (H) 0 - 149 mg/dL Final   Triglycerides Piccolo,Waived  Date Value Ref Range Status  07/12/2018 298 (H) <150 mg/dL Final    Comment:                            Normal                   <150                         Borderline High     150 - 199                         High                200 - 499                         Very High                >  73          Passed - Patient is not pregnant      Passed - Valid  encounter within last 12 months    Recent Outpatient Visits           8 months ago Tachycardia   [place] [facility] [place], Megan P, DO   9 months ago Acute pain of left knee   [place], Megan P, DO   10 months ago Type 2 diabetes mellitus with hyperglycemia, without long-term current use of insulin (HCC)   [place] [facility] [place], [place] P, DO   1 year ago Routine general medical examination at a health care facility   [facility] [place], [place] P, DO   1 year ago Hypothyroidism, unspecified type   [place] [facility], Megan P, DO               LORazepam (ATIVAN) 1 MG tablet [Pharmacy Med Name: LORAZEPAM 1MG  TABLETS] 10 tablet     Sig: TAKE 1 TABLET(1 MG) BY MOUTH DAILY AS NEEDED FOR ANXIETY     Not Delegated - Psychiatry: Anxiolytics/Hypnotics 2 Failed - 03/26/2023 12:32 PM      Failed - This refill cannot be delegated      Failed - Urine Drug Screen completed in last 360 days      Failed - Valid encounter within last 6 months    Recent Outpatient Visits           8 months ago Tachycardia   [facility] [place], Megan P, DO   9 months ago Acute pain of left knee   [facility], [facility] P, DO   10 months ago Type 2 diabetes mellitus with hyperglycemia, without long-term current use of insulin (HCC)   [place] [facility] [place], Megan P, DO   1 year ago Routine general medical examination at a health care facility   [facility] [place], [place] P, DO   1 year ago Hypothyroidism, unspecified type   [place] [facility] [place], [place], DO              Passed - Patient is not pregnant       FLUoxetine (PROZAC) 20 MG capsule [Pharmacy Med Name: FLUOXETINE 20MG  CAPSULES] 60 capsule 0    Sig: TAKE 1 CAPSULE(20 MG) BY MOUTH TWICE DAILY      Psychiatry:  Antidepressants - SSRI Failed - 03/26/2023 12:32 PM      Failed - Valid encounter within last 6 months    Recent Outpatient Visits           8 months ago Tachycardia   [facility] [place], Megan P, DO   9 months ago Acute pain of left knee   [place] [facility], [facility] P, DO   10 months ago Type 2 diabetes mellitus with hyperglycemia, without long-term current use of insulin (HCC)   [facility] [place], Megan P, DO   1 year ago Routine general medical examination at a health care facility   [facility] [place], [place] P, DO   1 year ago Hypothyroidism, unspecified type   [place] [facility] [place], Megan P, DO              Passed - Completed PHQ-2 or  PHQ-9 in the last 360 days

## 2023-03-29 NOTE — Telephone Encounter (Signed)
Reordered 03/28/23 Dr Laural Benes  Requested Prescriptions  Refused Prescriptions Disp Refills   ezetimibe (ZETIA) 10 MG tablet [Pharmacy Med Name: EZETIMIBE 10MG  TABLETS] 90 tablet     Sig: TAKE 1 TABLET(10 MG) BY MOUTH DAILY     Cardiovascular:  Antilipid - Sterol Transport Inhibitors Failed - 03/28/2023 12:18 PM      Failed - AST in normal range and within 360 days    AST  Date Value Ref Range Status  07/09/2022 47 (H) 0 - 40 IU/L Final   AST (SGOT) Piccolo, Waived  Date Value Ref Range Status  07/12/2018 40 (H) 11 - 38 U/L Final         Failed - ALT in normal range and within 360 days    ALT  Date Value Ref Range Status  07/09/2022 66 (H) 0 - 44 IU/L Final   ALT (SGPT) Piccolo, Waived  Date Value Ref Range Status  07/12/2018 61 (H) 10 - 47 U/L Final         Failed - Lipid Panel in normal range within the last 12 months    Cholesterol, Total  Date Value Ref Range Status  07/09/2022 178 100 - 199 mg/dL Final   Cholesterol Piccolo, Waived  Date Value Ref Range Status  07/12/2018 214 (H) <200 mg/dL Final    Comment:                            Desirable                <200                         Borderline High      200- 239                         High                     >239    LDL Chol Calc (NIH)  Date Value Ref Range Status  07/09/2022 94 0 - 99 mg/dL Final   HDL  Date Value Ref Range Status  07/09/2022 34 (L) >39 mg/dL Final   Triglycerides  Date Value Ref Range Status  07/09/2022 297 (H) 0 - 149 mg/dL Final   Triglycerides Piccolo,Waived  Date Value Ref Range Status  07/12/2018 298 (H) <150 mg/dL Final    Comment:                            Normal                   <150                         Borderline High     150 - 199                         High                200 - 499                         Very High                >499  Passed - Patient is not pregnant      Passed - Valid encounter within last 12 months    Recent Outpatient  Visits           8 months ago Tachycardia   [place] [facility] [facility] [place], Megan P, DO   9 months ago Acute pain of left knee   [facility] [place], Megan P, DO   10 months ago Type 2 diabetes mellitus with hyperglycemia, without long-term current use of insulin [facility])   [place] [facility] [place], [place] P, DO   1 year ago Routine general medical examination at a health care facility   [facility] [place], [place] P, DO   1 year ago Hypothyroidism, unspecified type   [facility] Dorcas Carrow, DO       Future Appointments             In 3 days Dorcas Carrow, DO [facility]

## 2023-04-01 ENCOUNTER — Ambulatory Visit (INDEPENDENT_AMBULATORY_CARE_PROVIDER_SITE_OTHER): Payer: 59 | Admitting: Family Medicine

## 2023-04-01 ENCOUNTER — Encounter: Payer: Self-pay | Admitting: Family Medicine

## 2023-04-01 VITALS — BP 115/79 | HR 86 | Temp 97.9°F | Ht 65.5 in | Wt 212.8 lb

## 2023-04-01 DIAGNOSIS — E1165 Type 2 diabetes mellitus with hyperglycemia: Secondary | ICD-10-CM | POA: Diagnosis not present

## 2023-04-01 DIAGNOSIS — Z0000 Encounter for general adult medical examination without abnormal findings: Secondary | ICD-10-CM

## 2023-04-01 DIAGNOSIS — E7800 Pure hypercholesterolemia, unspecified: Secondary | ICD-10-CM

## 2023-04-01 DIAGNOSIS — Z1211 Encounter for screening for malignant neoplasm of colon: Secondary | ICD-10-CM

## 2023-04-01 DIAGNOSIS — F339 Major depressive disorder, recurrent, unspecified: Secondary | ICD-10-CM

## 2023-04-01 DIAGNOSIS — F419 Anxiety disorder, unspecified: Secondary | ICD-10-CM

## 2023-04-01 DIAGNOSIS — E039 Hypothyroidism, unspecified: Secondary | ICD-10-CM

## 2023-04-01 MED ORDER — FLUOXETINE HCL 20 MG PO CAPS: ORAL_CAPSULE | ORAL | 1 refills | Status: DC

## 2023-04-01 MED ORDER — METFORMIN HCL 500 MG PO TABS: 500.0000 mg | ORAL_TABLET | Freq: Two times a day (BID) | ORAL | 1 refills | Status: DC

## 2023-04-01 MED ORDER — LORAZEPAM 1 MG PO TABS
ORAL_TABLET | ORAL | 0 refills | Status: AC
Start: 2023-04-01 — End: ?

## 2023-04-01 MED ORDER — EZETIMIBE 10 MG PO TABS: ORAL_TABLET | ORAL | 1 refills | Status: DC

## 2023-04-01 NOTE — Assessment & Plan Note (Signed)
Under good control on current regimen. Continue current regimen. Continue to monitor. Call with any concerns. Refills given.   

## 2023-04-01 NOTE — Progress Notes (Signed)
BP 115/79   Pulse 86   Temp 97.9 F (36.6 C) (Oral)   Ht 5' 5.5" (1.664 m)   Wt 212 lb 12.8 oz (96.5 kg)   SpO2 97%   BMI 34.87 kg/m    Subjective:    Patient ID: Michael Gillespie, male    DOB: January 09, 1966, 57 y.o.   MRN: [id number]  HPI: Michael Gillespie is a 57 y.o. male presenting on 04/01/2023 for comprehensive medical examination. Current medical complaints include:  DIABETES Hypoglycemic episodes:no Polydipsia/polyuria: no Visual disturbance: no Chest pain: no Paresthesias: no Glucose Monitoring: no  Accucheck frequency: Not Checking Taking Insulin?: no Blood Pressure Monitoring: not checking Retinal Examination: Not up to Date Foot Exam: Up to Date Diabetic Education: Completed Pneumovax: Not up to Date Influenza: Not up to Date Aspirin: no  HYPERLIPIDEMIA Hyperlipidemia status: excellent compliance Satisfied with current treatment?  yes Side effects:  no Medication compliance: excellent compliance Past cholesterol meds: zetia Supplements: none Aspirin:  no The 10-year ASCVD risk score (Arnett DK, et al., 2019) is: 10.7%   Values used to calculate the score:     Age: 54 years     Sex: Male     Is Non-Hispanic African American: No     Diabetic: Yes     Tobacco smoker: No     Systolic Blood Pressure: 115 mmHg     Is BP treated: No     HDL Cholesterol: 34 mg/dL     Total Cholesterol: 178 mg/dL Chest pain:  no Coronary artery disease:  no  HYPOTHYROIDISM Thyroid control status:controlled Satisfied with current treatment? yes Medication side effects: no Medication compliance: excellent compliance Recent dose adjustment:no Fatigue: yes Cold intolerance: no Heat intolerance: no Weight gain: no Weight loss: no Constipation: no Diarrhea/loose stools: no Palpitations: no Lower extremity edema: no Anxiety/depressed mood: no  DEPRESSION Mood status: controlled Satisfied with current treatment?: yes Symptom severity: mild  Duration of current  treatment : chronic Side effects: no Medication compliance: excellent compliance Psychotherapy/counseling: no  Previous psychiatric medications: prozac, lorazepam Depressed mood: no Anxious mood: yes Anhedonia: no Significant weight loss or gain: no Insomnia: yes  Fatigue: yes Feelings of worthlessness or guilt: no Impaired concentration/indecisiveness: no Suicidal ideations: no Hopelessness: no Crying spells: no    04/01/2023    3:40 PM 07/09/2022    2:38 PM 05/07/2022    3:38 PM 01/15/2022    8:48 AM 08/21/2021    1:37 PM  Depression screen PHQ 2/9  Decreased Interest 1 0 1 0 0  Down, Depressed, Hopeless 1 1 1  0 0  PHQ - 2 Score 2 1 2  0 0  Altered sleeping 1 0 1 0   Tired, decreased energy 1 1 1 1    Change in appetite 1 0 1 0   Feeling bad or failure about yourself  0 0 0 0   Trouble concentrating 0 0 0 0   Moving slowly or fidgety/restless 0 0 0 0   Suicidal thoughts 0 0 0 0   PHQ-9 Score 5 2 5 1    Difficult doing work/chores Not difficult at all Not difficult at all Not difficult at all     Interim Problems from his last visit: no  Depression Screen done today and results listed below:     04/01/2023    3:40 PM 07/09/2022    2:38 PM 05/07/2022    3:38 PM 01/15/2022    8:48 AM 08/21/2021    1:37 PM  Depression screen PHQ  2/9  Decreased Interest 1 0 1 0 0  Down, Depressed, Hopeless 1 1 1  0 0  PHQ - 2 Score 2 1 2  0 0  Altered sleeping 1 0 1 0   Tired, decreased energy 1 1 1 1    Change in appetite 1 0 1 0   Feeling bad or failure about yourself  0 0 0 0   Trouble concentrating 0 0 0 0   Moving slowly or fidgety/restless 0 0 0 0   Suicidal thoughts 0 0 0 0   PHQ-9 Score 5 2 5 1    Difficult doing work/chores Not difficult at all Not difficult at all Not difficult at all      Past Medical History:  Past Medical History:  Diagnosis Date   Anxiety    GERD (gastroesophageal reflux disease)    History of nephrolithiasis    Hyperlipidemia    Hypothyroidism    Low  testosterone     Surgical History:  History reviewed. No pertinent surgical history.  Medications:  Current Outpatient Medications on File Prior to Visit  Medication Sig   levothyroxine (SYNTHROID) 125 MCG tablet Take 1 tablet (125 mcg total) by mouth daily before breakfast.   No current facility-administered medications on file prior to visit.    Allergies:  No Known Allergies  Social History:  Social History   Socioeconomic History   Marital status: Married    Spouse name: Not on file   Number of children: Not on file   Years of education: Not on file   Highest education level: Not on file  Occupational History   Not on file  Tobacco Use   Smoking status: Never   Smokeless tobacco: Never  Vaping Use   Vaping Use: Never used  Substance and Sexual Activity   Alcohol use: No   Drug use: No   Sexual activity: Not Currently  Other Topics Concern   Not on file  Social History Narrative   Not on file   Social Determinants of Health   Financial Resource Strain: Low Risk  (04/01/2023)   Overall Financial Resource Strain (CARDIA)    Difficulty of Paying Living Expenses: Not very hard  Food Insecurity: No Food Insecurity (04/01/2023)   Hunger Vital Sign    Worried About Running Out of Food in the Last Year: Never true    Ran Out of Food in the Last Year: Never true  Transportation Needs: No Transportation Needs (04/01/2023)   PRAPARE - Administrator, Civil Service (Medical): No    Lack of Transportation (Non-Medical): No  Physical Activity: Unknown (04/01/2023)   Exercise Vital Sign    Days of Exercise per Week: 0 days    Minutes of Exercise per Session: Not on file  Stress: Stress Concern Present (04/01/2023)   [employer] of Occupational Health - Occupational Stress Questionnaire    Feeling of Stress : To some extent  Social Connections: Socially Isolated (04/01/2023)   Social Connection and Isolation Panel [NHANES]    Frequency of Communication  with Friends and Family: Once a week    Frequency of Social Gatherings with Friends and Family: Never    Attends Religious Services: Never    Database administrator or Organizations: No    Attends Engineer, structural: Not on file    Marital Status: Married  Catering manager Violence: Not At Risk (08/21/2021)   Humiliation, Afraid, Rape, and Kick questionnaire    Fear of Current or Ex-Partner: No  Emotionally Abused: No    Physically Abused: No    Sexually Abused: No   Social History   Tobacco Use  Smoking Status Never  Smokeless Tobacco Never   Social History   Substance and Sexual Activity  Alcohol Use No    Family History:  Family History  Problem Relation Age of Onset   Mental illness Mother    Crohn's disease Mother    Cancer Father        prostate   Stroke Maternal Grandmother    Bladder Cancer Neg Hx    Kidney cancer Neg Hx     Past medical history, surgical history, medications, allergies, family history and social history reviewed with patient today and changes made to appropriate areas of the chart.   Review of Systems  Constitutional: Negative.   HENT: Negative.    Eyes: Negative.   Respiratory: Negative.    Cardiovascular: Negative.   Gastrointestinal: Negative.   Genitourinary: Negative.   Musculoskeletal:  Positive for joint pain. Negative for back pain, falls, myalgias and neck pain.  Skin:  Positive for rash. Negative for itching.  Neurological: Negative.   Endo/Heme/Allergies: Negative.   Psychiatric/Behavioral:  Negative for depression, hallucinations, memory loss, substance abuse and suicidal ideas. The patient has insomnia. The patient is not nervous/anxious.    All other ROS negative except what is listed above and in the HPI.      Objective:    BP 115/79   Pulse 86   Temp 97.9 F (36.6 C) (Oral)   Ht 5' 5.5" (1.664 m)   Wt 212 lb 12.8 oz (96.5 kg)   SpO2 97%   BMI 34.87 kg/m   Wt Readings from Last 3 Encounters:   04/01/23 212 lb 12.8 oz (96.5 kg)  07/09/22 212 lb 12.8 oz (96.5 kg)  06/09/22 212 lb 12.8 oz (96.5 kg)    Physical Exam Vitals and nursing note reviewed.  Constitutional:      General: He is not in acute distress.    Appearance: Normal appearance. He is obese. He is not ill-appearing, toxic-appearing or diaphoretic.  HENT:     Head: Normocephalic and atraumatic.     Right Ear: Tympanic membrane, ear canal and external ear normal. There is no impacted cerumen.     Left Ear: Tympanic membrane, ear canal and external ear normal. There is no impacted cerumen.     Nose: Nose normal. No congestion or rhinorrhea.     Mouth/Throat:     Mouth: Mucous membranes are moist.     Pharynx: Oropharynx is clear. No oropharyngeal exudate or posterior oropharyngeal erythema.  Eyes:     General: No scleral icterus.       Right eye: No discharge.        Left eye: No discharge.     Extraocular Movements: Extraocular movements intact.     Conjunctiva/sclera: Conjunctivae normal.     Pupils: Pupils are equal, round, and reactive to light.  Neck:     Vascular: No carotid bruit.  Cardiovascular:     Rate and Rhythm: Normal rate and regular rhythm.     Pulses: Normal pulses.     Heart sounds: No murmur heard.    No friction rub. No gallop.  Pulmonary:     Effort: Pulmonary effort is normal. No respiratory distress.     Breath sounds: Normal breath sounds. No stridor. No wheezing, rhonchi or rales.  Chest:     Chest wall: No tenderness.  Abdominal:  General: Abdomen is flat. Bowel sounds are normal. There is no distension.     Palpations: Abdomen is soft. There is no mass.     Tenderness: There is no abdominal tenderness. There is no right CVA tenderness, left CVA tenderness, guarding or rebound.     Hernia: No hernia is present.  Genitourinary:    Comments: Genital exam deferred with shared decision making Musculoskeletal:        General: No swelling, tenderness, deformity or signs of injury.  Normal range of motion.     Cervical back: Normal range of motion and neck supple. No rigidity. No muscular tenderness.     Right lower leg: No edema.     Left lower leg: No edema.  Lymphadenopathy:     Cervical: No cervical adenopathy.  Skin:    General: Skin is warm and dry.     Capillary Refill: Capillary refill takes less than 2 seconds.     Coloration: Skin is not jaundiced or pale.     Findings: No bruising, erythema, lesion or rash.  Neurological:     General: No focal deficit present.     Mental Status: He is alert and oriented to person, place, and time.     Cranial Nerves: No cranial nerve deficit.     Sensory: No sensory deficit.     Motor: No weakness.     Coordination: Coordination normal.     Gait: Gait normal.     Deep Tendon Reflexes: Reflexes normal.  Psychiatric:        Mood and Affect: Mood normal.        Behavior: Behavior normal.        Thought Content: Thought content normal.        Judgment: Judgment normal.     Results for orders placed or performed in visit on 11/18/22  TSH  Result Value Ref Range   TSH 2.090 0.450 - 4.500 uIU/mL      Assessment & Plan:   Problem List Items Addressed This Visit       Endocrine   Hypothyroidism    Rechecking labs today. Await results. Treat as needed.       Relevant Orders   Comprehensive metabolic panel   CBC with Differential/Platelet   TSH   Type 2 diabetes mellitus with hyperglycemia (HCC)    Doing OK with A1c of 6.8. Will increase his metformin to 500mg  BID and recheck 6 months. Call with any concerns.       Relevant Medications   metFORMIN (GLUCOPHAGE) 500 MG tablet   Other Relevant Orders   Comprehensive metabolic panel   CBC with Differential/Platelet   Bayer DCA Hb A1c Waived   Microalbumin, Urine Waived     Other   Hyperlipidemia    Under good control on current regimen. Continue current regimen. Continue to monitor. Call with any concerns. Refills given. Labs drawn today.        Relevant Medications   ezetimibe (ZETIA) 10 MG tablet   Other Relevant Orders   Comprehensive metabolic panel   CBC with Differential/Platelet   Lipid Panel w/o Chol/HDL Ratio   Anxiety    Under good control on current regimen. Continue current regimen. Continue to monitor. Call with any concerns. Refills given.       Relevant Medications   LORazepam (ATIVAN) 1 MG tablet   FLUoxetine (PROZAC) 20 MG capsule   Depression, recurrent (HCC)    Under good control on current regimen. Continue current regimen. Continue to  monitor. Call with any concerns. Refills given.        Relevant Medications   LORazepam (ATIVAN) 1 MG tablet   FLUoxetine (PROZAC) 20 MG capsule   Other Relevant Orders   Comprehensive metabolic panel   CBC with Differential/Platelet   Other Visit Diagnoses     Routine general medical examination at a health care facility    -  Primary   Vaccines up to date. Screening labs checked today. Cologuard ordered today. Continue diet and exercise. Call with any concerns.   Relevant Orders   Comprehensive metabolic panel   CBC with Differential/Platelet   PSA   Urinalysis, Routine w reflex microscopic   Screening for colon cancer       Cologuard ordered today.   Relevant Orders   Cologuard       LABORATORY TESTING:  Health maintenance labs ordered today as discussed above.   The natural history of prostate cancer and ongoing controversy regarding screening and potential treatment outcomes of prostate cancer has been discussed with the patient. The meaning of a false positive PSA and a false negative PSA has been discussed. He indicates understanding of the limitations of this screening test and wishes to proceed with screening PSA testing.   IMMUNIZATIONS:   - Tdap: Tetanus vaccination status reviewed: last tetanus booster within 10 years. - Influenza: Postponed to flu season - Pneumovax: Not applicable - Prevnar: Not applicable - COVID: Refused - HPV: Not  applicable - Shingrix vaccine: Refused  SCREENING: - Colonoscopy: Ordered today  Discussed with patient purpose of the colonoscopy is to detect colon cancer at curable precancerous or early stages   PATIENT COUNSELING:    Sexuality: Discussed sexually transmitted diseases, partner selection, use of condoms, avoidance of unintended pregnancy  and contraceptive alternatives.   Advised to avoid cigarette smoking.  I discussed with the patient that most people either abstain from alcohol or drink within safe limits (<=14/week and <=4 drinks/occasion for males, <=7/weeks and <= 3 drinks/occasion for females) and that the risk for alcohol disorders and other health effects rises proportionally with the number of drinks per week and how often a drinker exceeds daily limits.  Discussed cessation/primary prevention of drug use and availability of treatment for abuse.   Diet: Encouraged to adjust caloric intake to maintain  or achieve ideal body weight, to reduce intake of dietary saturated fat and total fat, to limit sodium intake by avoiding high sodium foods and not adding table salt, and to maintain adequate dietary potassium and calcium preferably from fresh fruits, vegetables, and low-fat dairy products.    stressed the importance of regular exercise  Injury prevention: Discussed safety belts, safety helmets, smoke detector, smoking near bedding or upholstery.   Dental health: Discussed importance of regular tooth brushing, flossing, and dental visits.   Follow up plan: NEXT PREVENTATIVE PHYSICAL DUE IN 1 YEAR. Return in about 6 months (around 10/01/2023).

## 2023-04-01 NOTE — Assessment & Plan Note (Signed)
Under good control on current regimen. Continue current regimen. Continue to monitor. Call with any concerns. Refills given. Labs drawn today.   

## 2023-04-01 NOTE — Assessment & Plan Note (Signed)
Rechecking labs today. Await results. Treat as needed.  °

## 2023-04-01 NOTE — Assessment & Plan Note (Signed)
Doing OK with A1c of 6.8. Will increase his metformin to 500mg  BID and recheck 6 months. Call with any concerns.

## 2023-04-02 LAB — URINALYSIS, ROUTINE W REFLEX MICROSCOPIC
Bilirubin, UA: NEGATIVE
Glucose, UA: NEGATIVE
Ketones, UA: NEGATIVE
Leukocytes,UA: NEGATIVE
Nitrite, UA: NEGATIVE
Protein,UA: NEGATIVE
RBC, UA: NEGATIVE
Specific Gravity, UA: 1.025 (ref 1.005–1.030)
Urobilinogen, Ur: 0.2 mg/dL (ref 0.2–1.0)
pH, UA: 5.5 (ref 5.0–7.5)

## 2023-04-02 LAB — CBC WITH DIFFERENTIAL/PLATELET
Basophils Absolute: 0.1 10*3/uL (ref 0.0–0.2)
Basos: 1 %
EOS (ABSOLUTE): 0.3 10*3/uL (ref 0.0–0.4)
Eos: 5 %
Hematocrit: 41.9 % (ref 37.5–51.0)
Hemoglobin: 14.3 g/dL (ref 13.0–17.7)
Immature Grans (Abs): 0 10*3/uL (ref 0.0–0.1)
Immature Granulocytes: 1 %
Lymphocytes Absolute: 2.2 10*3/uL (ref 0.7–3.1)
Lymphs: 33 %
MCH: 30.1 pg (ref 26.6–33.0)
MCHC: 34.1 g/dL (ref 31.5–35.7)
MCV: 88 fL (ref 79–97)
Monocytes Absolute: 0.6 10*3/uL (ref 0.1–0.9)
Monocytes: 9 %
Neutrophils Absolute: 3.4 10*3/uL (ref 1.4–7.0)
Neutrophils: 51 %
Platelets: 233 10*3/uL (ref 150–450)
RBC: 4.75 x10E6/uL (ref 4.14–5.80)
RDW: 13 % (ref 11.6–15.4)
WBC: 6.6 10*3/uL (ref 3.4–10.8)

## 2023-04-02 LAB — LIPID PANEL W/O CHOL/HDL RATIO
Cholesterol, Total: 260 mg/dL — ABNORMAL HIGH (ref 100–199)
HDL: 29 mg/dL — ABNORMAL LOW (ref >39)
LDL Chol Calc (NIH): 124 mg/dL — ABNORMAL HIGH (ref 0–99)
Triglycerides: 588 mg/dL (ref 0–149)
VLDL Cholesterol Cal: 107 mg/dL — ABNORMAL HIGH (ref 5–40)

## 2023-04-02 LAB — COMPREHENSIVE METABOLIC PANEL
ALT: 68 IU/L — ABNORMAL HIGH (ref 0–44)
AST: 43 IU/L — ABNORMAL HIGH (ref 0–40)
Albumin: 4.5 g/dL (ref 3.8–4.9)
Alkaline Phosphatase: 83 IU/L (ref 44–121)
BUN/Creatinine Ratio: 13 (ref 9–20)
BUN: 18 mg/dL (ref 6–24)
Bilirubin Total: 0.2 mg/dL (ref 0.0–1.2)
CO2: 23 mmol/L (ref 20–29)
Calcium: 9.2 mg/dL (ref 8.7–10.2)
Chloride: 100 mmol/L (ref 96–106)
Creatinine, Ser: 1.41 mg/dL — ABNORMAL HIGH (ref 0.76–1.27)
Globulin, Total: 2.9 g/dL (ref 1.5–4.5)
Glucose: 100 mg/dL — ABNORMAL HIGH (ref 70–99)
Potassium: 4.5 mmol/L (ref 3.5–5.2)
Sodium: 139 mmol/L (ref 134–144)
Total Protein: 7.4 g/dL (ref 6.0–8.5)
eGFR: 59 mL/min/{1.73_m2} — ABNORMAL LOW (ref >59)

## 2023-04-02 LAB — MICROALBUMIN, URINE WAIVED
Creatinine, Urine Waived: 200 mg/dL (ref 10–300)
Microalb, Ur Waived: 30 mg/L — ABNORMAL HIGH (ref 0–19)
Microalb/Creat Ratio: <30 mg/g (ref <30)

## 2023-04-02 LAB — PSA: Prostate Specific Ag, Serum: 1 ng/mL (ref 0.0–4.0)

## 2023-04-02 LAB — TSH: TSH: 0.722 u[IU]/mL (ref 0.450–4.500)

## 2023-04-02 LAB — BAYER DCA HB A1C WAIVED: HB A1C (BAYER DCA - WAIVED): 6.8 % — ABNORMAL HIGH (ref 4.8–5.6)

## 2023-04-04 ENCOUNTER — Other Ambulatory Visit: Payer: Self-pay | Admitting: Family Medicine

## 2023-04-04 DIAGNOSIS — N289 Disorder of kidney and ureter, unspecified: Secondary | ICD-10-CM

## 2023-04-22 ENCOUNTER — Other Ambulatory Visit: Payer: 59

## 2023-04-22 DIAGNOSIS — N289 Disorder of kidney and ureter, unspecified: Secondary | ICD-10-CM

## 2023-04-23 ENCOUNTER — Encounter: Payer: Self-pay | Admitting: Family Medicine

## 2023-04-23 LAB — BASIC METABOLIC PANEL
BUN/Creatinine Ratio: 18 (ref 9–20)
BUN: 20 mg/dL (ref 6–24)
CO2: 23 mmol/L (ref 20–29)
Calcium: 9.4 mg/dL (ref 8.7–10.2)
Chloride: 100 mmol/L (ref 96–106)
Creatinine, Ser: 1.13 mg/dL (ref 0.76–1.27)
Glucose: 89 mg/dL (ref 70–99)
Potassium: 4.5 mmol/L (ref 3.5–5.2)
Sodium: 139 mmol/L (ref 134–144)
eGFR: 77 mL/min/{1.73_m2} (ref >59)

## 2023-04-23 NOTE — Progress Notes (Signed)
Contacted via MyChart   Good evening Michael Gillespie, your labs have returned and kidney function much improved.  Continue good fluid intake at home. Great job!!

## 2023-08-30 ENCOUNTER — Encounter: Payer: Self-pay | Admitting: Family Medicine

## 2023-09-30 ENCOUNTER — Ambulatory Visit: Payer: 59 | Admitting: Family Medicine

## 2023-10-03 ENCOUNTER — Ambulatory Visit: Payer: 59 | Admitting: Family Medicine

## 2023-10-13 ENCOUNTER — Other Ambulatory Visit: Payer: Self-pay | Admitting: Family Medicine

## 2023-10-17 ENCOUNTER — Other Ambulatory Visit: Payer: Self-pay | Admitting: Family Medicine

## 2023-10-17 NOTE — Telephone Encounter (Signed)
 Requested Prescriptions  Pending Prescriptions Disp Refills   metFORMIN  (GLUCOPHAGE ) 500 MG tablet [Pharmacy Med Name: METFORMIN  500MG  TABLETS] 60 tablet 0    Sig: TAKE 1 TABLET(500 MG) BY MOUTH TWICE DAILY WITH A MEAL     Endocrinology:  Diabetes - Biguanides Failed - 10/17/2023  2:32 PM      Failed - HBA1C is between 0 and 7.9 and within 180 days    HB A1C (BAYER DCA - WAIVED)  Date Value Ref Range Status  04/01/2023 6.8 (H) 4.8 - 5.6 % Final    Comment:             Prediabetes: 5.7 - 6.4          Diabetes: >6.4          Glycemic control for adults with diabetes: <7.0          Failed - B12 Level in normal range and within 720 days    Vitamin B-12  Date Value Ref Range Status  08/03/2017 585 232 - 1,245 pg/mL Final         Failed - Valid encounter within last 6 months    Recent Outpatient Visits           6 months ago Routine general medical examination at a health care facility   [facility] [place], [place] P, DO   1 year ago Tachycardia   [facility] [place], [place] P, DO   1 year ago Acute pain of left knee   [place] [facility] [place], Megan P, DO   1 year ago Type 2 diabetes mellitus with hyperglycemia, without long-term current use of insulin (HCC)    [facility] [place], Megan P, DO   1 year ago Routine general medical examination at a health care facility   [facility] [place], Duwaine SQUIBB, DO       Future Appointments             In 4 days [place] Duwaine SQUIBB, DO  [facility]            Passed - Cr in normal range and within 360 days    Creatinine, Ser  Date Value Ref Range Status  04/22/2023 1.13 0.76 - 1.27 mg/dL Final         Passed - eGFR in normal range and within 360 days    GFR calc Af Amer  Date Value Ref Range Status  09/02/2020 92 >59 mL/min/1.73 Final    Comment:    **In accordance with recommendations  from the NKF-ASN Task force,**   [facility] is in the process of updating its eGFR calculation to the   2021 CKD-EPI creatinine equation that estimates kidney function   without a race variable.    GFR calc non Af Amer  Date Value Ref Range Status  09/02/2020 80 >59 mL/min/1.73 Final   eGFR  Date Value Ref Range Status  04/22/2023 77 >59 mL/min/1.73 Final         Passed - CBC within normal limits and completed in the last 12 months    WBC  Date Value Ref Range Status  04/01/2023 6.6 3.4 - 10.8 x10E3/uL Final   RBC  Date Value Ref Range Status  04/01/2023 4.75 4.14 - 5.80 x10E6/uL Final   Hemoglobin  Date Value Ref Range Status  04/01/2023 14.3 13.0 - 17.7 g/dL Final   Hematocrit  Date Value Ref Range  Status  04/01/2023 41.9 37.5 - 51.0 % Final   MCHC  Date Value Ref Range Status  04/01/2023 34.1 31.5 - 35.7 g/dL Final   [facility]  Date Value Ref Range Status  04/01/2023 30.1 26.6 - 33.0 pg Final   MCV  Date Value Ref Range Status  04/01/2023 88 79 - 97 fL Final   No results found for: PLTCOUNTKUC, LABPLAT, POCPLA RDW  Date Value Ref Range Status  04/01/2023 13.0 11.6 - 15.4 % Final

## 2023-10-18 NOTE — Telephone Encounter (Signed)
 Reordered 10/17/23 #60 Must keep appt for refills

## 2023-10-21 ENCOUNTER — Ambulatory Visit: Payer: 59 | Admitting: Family Medicine

## 2023-11-02 ENCOUNTER — Other Ambulatory Visit: Payer: Self-pay | Admitting: Family Medicine

## 2023-11-02 NOTE — Telephone Encounter (Signed)
Requested Prescriptions  Pending Prescriptions Disp Refills   ezetimibe (ZETIA) 10 MG tablet [Pharmacy Med Name: EZETIMIBE 10MG  TABLETS] 90 tablet 0    Sig: TAKE 1 TABLET(10 MG) BY MOUTH DAILY     Cardiovascular:  Antilipid - Sterol Transport Inhibitors Failed - 11/02/2023  2:12 PM      Failed - AST in normal range and within 360 days    AST  Date Value Ref Range Status  04/01/2023 43 (H) 0 - 40 IU/L Final   AST (SGOT) Piccolo, Waived  Date Value Ref Range Status  07/12/2018 40 (H) 11 - 38 U/L Final         Failed - ALT in normal range and within 360 days    ALT  Date Value Ref Range Status  04/01/2023 68 (H) 0 - 44 IU/L Final   ALT (SGPT) Piccolo, Waived  Date Value Ref Range Status  07/12/2018 61 (H) 10 - 47 U/L Final         Failed - Lipid Panel in normal range within the last 12 months    Cholesterol, Total  Date Value Ref Range Status  04/01/2023 260 (H) 100 - 199 mg/dL Final   Cholesterol Piccolo, Waived  Date Value Ref Range Status  07/12/2018 214 (H) <200 mg/dL Final    Comment:                            Desirable                <200                         Borderline High      200- 239                         High                     >239    LDL Chol Calc (NIH)  Date Value Ref Range Status  04/01/2023 124 (H) 0 - 99 mg/dL Final   HDL  Date Value Ref Range Status  04/01/2023 29 (L) >39 mg/dL Final   Triglycerides  Date Value Ref Range Status  04/01/2023 588 (HH) 0 - 149 mg/dL Final   Triglycerides Piccolo,Waived  Date Value Ref Range Status  07/12/2018 298 (H) <150 mg/dL Final    Comment:                            Normal                   <150                         Borderline High     150 - 199                         High                200 - 499                         Very High                >499  Passed - Patient is not pregnant      Passed - Valid encounter within last 12 months    Recent Outpatient Visits           7  months ago Routine general medical examination at a health care facility   [facility] [place], [place] P, DO   1 year ago Tachycardia   [place] [facility] [place], [place] P, DO   1 year ago Acute pain of left knee   [place] [facility] [place], Megan P, DO   1 year ago Type 2 diabetes mellitus with hyperglycemia, without long-term current use of insulin [facility])   [place] [facility] [place], Megan P, DO   1 year ago Routine general medical examination at a health care facility   [facility] Dorcas Carrow, DO       Future Appointments             In 1 week Dorcas Carrow, DO [place] [facility]

## 2023-11-11 ENCOUNTER — Ambulatory Visit (INDEPENDENT_AMBULATORY_CARE_PROVIDER_SITE_OTHER): Payer: 59 | Admitting: Family Medicine

## 2023-11-11 ENCOUNTER — Encounter: Payer: Self-pay | Admitting: Family Medicine

## 2023-11-11 VITALS — BP 108/73 | HR 94 | Temp 98.2°F | Ht 65.5 in | Wt 211.8 lb

## 2023-11-11 DIAGNOSIS — E7800 Pure hypercholesterolemia, unspecified: Secondary | ICD-10-CM | POA: Diagnosis not present

## 2023-11-11 DIAGNOSIS — F339 Major depressive disorder, recurrent, unspecified: Secondary | ICD-10-CM

## 2023-11-11 DIAGNOSIS — E039 Hypothyroidism, unspecified: Secondary | ICD-10-CM

## 2023-11-11 DIAGNOSIS — E1165 Type 2 diabetes mellitus with hyperglycemia: Secondary | ICD-10-CM

## 2023-11-11 LAB — BAYER DCA HB A1C WAIVED: HB A1C (BAYER DCA - WAIVED): 6.4 % — ABNORMAL HIGH (ref 4.8–5.6)

## 2023-11-11 MED ORDER — FLUOXETINE HCL 20 MG PO CAPS: ORAL_CAPSULE | ORAL | 1 refills | Status: AC

## 2023-11-11 MED ORDER — METFORMIN HCL 500 MG PO TABS: 500.0000 mg | ORAL_TABLET | Freq: Two times a day (BID) | ORAL | 1 refills | Status: AC

## 2023-11-11 MED ORDER — EZETIMIBE 10 MG PO TABS: ORAL_TABLET | ORAL | 1 refills | Status: DC

## 2023-11-11 NOTE — Progress Notes (Signed)
BP 108/73   Pulse 94   Temp 98.2 F (36.8 C) (Oral)   Ht 5' 5.5" (1.664 m)   Wt 211 lb 12.8 oz (96.1 kg)   SpO2 96%   BMI 34.71 kg/m    Subjective:    Patient ID: Michael Gillespie, male    DOB: 1966-05-17, 58 y.o.   MRN: [id number]  HPI: Michael Gillespie is a 58 y.o. male  Chief Complaint  Patient presents with   Hypothyroidism   Diabetes    Patient most recent Diabetic Eye Exam requested at today's visit.    UPPER RESPIRATORY TRACT INFECTION Duration: couple of days Worst symptom: congestion, drainage Fever: no Cough: no Shortness of breath: no Wheezing: no Chest pain: no Chest tightness: no Chest congestion: no Nasal congestion: yes Runny nose: yes Post nasal drip: yes Sneezing: yes Sore throat: yes Swollen glands: no Sinus pressure: no Headache: no Face pain: no Toothache: no Ear pain: no  Ear pressure: no  Eyes red/itching:no Eye drainage/crusting: no  Vomiting: no Rash: no Fatigue: yes Sick contacts: yes Strep contacts: no  Context: worse Recurrent sinusitis: no Relief with OTC cold/cough medications: no  Treatments attempted: cold/sinus and mucinex   DIABETES Hypoglycemic episodes:no Polydipsia/polyuria: no Visual disturbance: no Chest pain: no Paresthesias: no Glucose Monitoring: no  Accucheck frequency: Not Checking Taking Insulin?: no Blood Pressure Monitoring: not checking Retinal Examination: Up to Date Foot Exam: Up to Date Diabetic Education: Completed Pneumovax: Not up to Date Influenza: Not up to Date Aspirin: no  HYPERTENSION / HYPERLIPIDEMIA Satisfied with current treatment? yes Duration of hypertension: chronic BP monitoring frequency: not checking BP medication side effects: no Past BP meds: none Duration of hyperlipidemia: chronic Cholesterol medication side effects: no Cholesterol supplements: none Past cholesterol medications: zetia Medication compliance: excellent compliance Aspirin: no Recent stressors:  no Recurrent headaches: no Visual changes: no Palpitations: no Dyspnea: no Chest pain: no Lower extremity edema: no Dizzy/lightheaded: no  HYPOTHYROIDISM Thyroid control status:controlled Satisfied with current treatment? yes Medication side effects: yes Medication compliance: excellent compliance Recent dose adjustment:no Fatigue: no Cold intolerance: no Heat intolerance: no Weight gain: no Weight loss: no Constipation: no Diarrhea/loose stools: no Palpitations: no Lower extremity edema: no Anxiety/depressed mood: no  DEPRESSION Mood status: controlled Satisfied with current treatment?: yes Symptom severity: mild  Duration of current treatment : chronic Side effects: no Medication compliance: excellent compliance Psychotherapy/counseling: no  Previous psychiatric medications: prozac Depressed mood: no Anxious mood: no Anhedonia: no Significant weight loss or gain: no Insomnia: no  Fatigue: no Feelings of worthlessness or guilt: no Impaired concentration/indecisiveness: no Suicidal ideations: no Hopelessness: no Crying spells: no    11/11/2023    3:24 PM 04/01/2023    3:40 PM 07/09/2022    2:38 PM 05/07/2022    3:38 PM 01/15/2022    8:48 AM  Depression screen PHQ 2/9  Decreased Interest 0 1 0 1 0  Down, Depressed, Hopeless 1 1 1 1  0  PHQ - 2 Score 1 2 1 2  0  Altered sleeping 0 1 0 1 0  Tired, decreased energy 1 1 1 1 1   Change in appetite 1 1 0 1 0  Feeling bad or failure about yourself  0 0 0 0 0  Trouble concentrating 0 0 0 0 0  Moving slowly or fidgety/restless 0 0 0 0 0  Suicidal thoughts 0 0 0 0 0  PHQ-9 Score 3 5 2 5 1   Difficult doing work/chores Not difficult at all Not  difficult at all Not difficult at all Not difficult at all     Relevant past medical, surgical, family and social history reviewed and updated as indicated. Interim medical history since our last visit reviewed. Allergies and medications reviewed and updated.  Review of Systems   Constitutional: Negative.   Respiratory: Negative.    Cardiovascular: Negative.   Gastrointestinal: Negative.   Musculoskeletal: Negative.   Neurological: Negative.   Psychiatric/Behavioral: Negative.      Per HPI unless specifically indicated above     Objective:    BP 108/73   Pulse 94   Temp 98.2 F (36.8 C) (Oral)   Ht 5' 5.5" (1.664 m)   Wt 211 lb 12.8 oz (96.1 kg)   SpO2 96%   BMI 34.71 kg/m   Wt Readings from Last 3 Encounters:  11/11/23 211 lb 12.8 oz (96.1 kg)  04/01/23 212 lb 12.8 oz (96.5 kg)  07/09/22 212 lb 12.8 oz (96.5 kg)    Physical Exam Vitals and nursing note reviewed.  Constitutional:      General: He is not in acute distress.    Appearance: Normal appearance. He is not ill-appearing, toxic-appearing or diaphoretic.  HENT:     Head: Normocephalic and atraumatic.     Right Ear: External ear normal.     Left Ear: External ear normal.     Nose: Nose normal.     Mouth/Throat:     Mouth: Mucous membranes are moist.     Pharynx: Oropharynx is clear.  Eyes:     General: No scleral icterus.       Right eye: No discharge.        Left eye: No discharge.     Extraocular Movements: Extraocular movements intact.     Conjunctiva/sclera: Conjunctivae normal.     Pupils: Pupils are equal, round, and reactive to light.  Cardiovascular:     Rate and Rhythm: Normal rate and regular rhythm.     Pulses: Normal pulses.     Heart sounds: Normal heart sounds. No murmur heard.    No friction rub. No gallop.  Pulmonary:     Effort: Pulmonary effort is normal. No respiratory distress.     Breath sounds: Normal breath sounds. No stridor. No wheezing, rhonchi or rales.  Chest:     Chest wall: No tenderness.  Musculoskeletal:        General: Normal range of motion.     Cervical back: Normal range of motion and neck supple.  Skin:    General: Skin is warm and dry.     Capillary Refill: Capillary refill takes less than 2 seconds.     Coloration: Skin is not  jaundiced or pale.     Findings: No bruising, erythema, lesion or rash.  Neurological:     General: No focal deficit present.     Mental Status: He is alert and oriented to person, place, and time. Mental status is at baseline.  Psychiatric:        Mood and Affect: Mood normal.        Behavior: Behavior normal.        Thought Content: Thought content normal.        Judgment: Judgment normal.     Results for orders placed or performed in visit on 04/22/23  Basic metabolic panel   Collection Time: 04/22/23  1:49 PM  Result Value Ref Range   Glucose 89 70 - 99 mg/dL   BUN 20 6 - 24 mg/dL  Creatinine, Ser 1.13 0.76 - 1.27 mg/dL   eGFR 77 >16 XW/RUE/4.54   BUN/Creatinine Ratio 18 9 - 20   Sodium 139 134 - 144 mmol/L   Potassium 4.5 3.5 - 5.2 mmol/L   Chloride 100 96 - 106 mmol/L   CO2 23 20 - 29 mmol/L   Calcium 9.4 8.7 - 10.2 mg/dL      Assessment & Plan:   Problem List Items Addressed This Visit       Endocrine   Hypothyroidism - Primary   Rechecking labs today. Await results. Treat as needed.       Relevant Orders   CBC with Differential/Platelet   Comprehensive metabolic panel   TSH   Type 2 diabetes mellitus with hyperglycemia (HCC)   Doing great with A1c of 6.4 down from 6.8. Continue current regimen. Continue to monitor. Call with any concerns.       Relevant Medications   metFORMIN (GLUCOPHAGE) 500 MG tablet   Other Relevant Orders   Bayer DCA Hb A1c Waived   CBC with Differential/Platelet   Comprehensive metabolic panel     Other   Hyperlipidemia   Under good control on current regimen. Continue current regimen. Continue to monitor. Call with any concerns. Refills given. Labs drawn today.        Relevant Medications   ezetimibe (ZETIA) 10 MG tablet   Other Relevant Orders   CBC with Differential/Platelet   Comprehensive metabolic panel   Lipid Panel w/o Chol/HDL Ratio   Depression, recurrent (HCC)   Under good control on current regimen.  Continue current regimen. Continue to monitor. Call with any concerns. Refills given.       Relevant Medications   FLUoxetine (PROZAC) 20 MG capsule     Follow up plan: Return in about 5 months (around 04/09/2024) for physical.

## 2023-11-11 NOTE — Assessment & Plan Note (Signed)
 Rechecking labs today. Await results. Treat as needed.

## 2023-11-11 NOTE — Assessment & Plan Note (Signed)
 Under good control on current regimen. Continue current regimen. Continue to monitor. Call with any concerns. Refills given.

## 2023-11-11 NOTE — Assessment & Plan Note (Signed)
 Under good control on current regimen. Continue current regimen. Continue to monitor. Call with any concerns. Refills given. Labs drawn today.

## 2023-11-11 NOTE — Assessment & Plan Note (Signed)
Doing great with A1c of 6.4 down from 6.8. Continue current regimen. Continue to monitor. Call with any concerns.

## 2023-11-12 LAB — COMPREHENSIVE METABOLIC PANEL
ALT: 42 [IU]/L (ref 0–44)
AST: 42 [IU]/L — ABNORMAL HIGH (ref 0–40)
Albumin: 4.5 g/dL (ref 3.8–4.9)
Alkaline Phosphatase: 72 [IU]/L (ref 44–121)
BUN/Creatinine Ratio: 16 (ref 9–20)
BUN: 17 mg/dL (ref 6–24)
Bilirubin Total: 0.3 mg/dL (ref 0.0–1.2)
CO2: 20 mmol/L (ref 20–29)
Calcium: 9.2 mg/dL (ref 8.7–10.2)
Chloride: 101 mmol/L (ref 96–106)
Creatinine, Ser: 1.07 mg/dL (ref 0.76–1.27)
Globulin, Total: 2.7 g/dL (ref 1.5–4.5)
Glucose: 82 mg/dL (ref 70–99)
Potassium: 4.1 mmol/L (ref 3.5–5.2)
Sodium: 142 mmol/L (ref 134–144)
Total Protein: 7.2 g/dL (ref 6.0–8.5)
eGFR: 81 mL/min/{1.73_m2} (ref >59)

## 2023-11-12 LAB — CBC WITH DIFFERENTIAL/PLATELET
Basophils Absolute: 0 10*3/uL (ref 0.0–0.2)
Basos: 1 %
EOS (ABSOLUTE): 0.2 10*3/uL (ref 0.0–0.4)
Eos: 4 %
Hematocrit: 41.9 % (ref 37.5–51.0)
Hemoglobin: 13.8 g/dL (ref 13.0–17.7)
Immature Grans (Abs): 0.1 10*3/uL (ref 0.0–0.1)
Immature Granulocytes: 1 %
Lymphocytes Absolute: 1.8 10*3/uL (ref 0.7–3.1)
Lymphs: 30 %
MCH: 30.3 pg (ref 26.6–33.0)
MCHC: 32.9 g/dL (ref 31.5–35.7)
MCV: 92 fL (ref 79–97)
Monocytes Absolute: 0.7 10*3/uL (ref 0.1–0.9)
Monocytes: 12 %
Neutrophils Absolute: 3.2 10*3/uL (ref 1.4–7.0)
Neutrophils: 52 %
Platelets: 188 10*3/uL (ref 150–450)
RBC: 4.55 x10E6/uL (ref 4.14–5.80)
RDW: 14.5 % (ref 11.6–15.4)
WBC: 6.1 10*3/uL (ref 3.4–10.8)

## 2023-11-12 LAB — LIPID PANEL W/O CHOL/HDL RATIO
Cholesterol, Total: 240 mg/dL — ABNORMAL HIGH (ref 100–199)
HDL: 43 mg/dL (ref >39)
LDL Chol Calc (NIH): 141 mg/dL — ABNORMAL HIGH (ref 0–99)
Triglycerides: 310 mg/dL — ABNORMAL HIGH (ref 0–149)
VLDL Cholesterol Cal: 56 mg/dL — ABNORMAL HIGH (ref 5–40)

## 2023-11-12 LAB — TSH: TSH: 2.37 u[IU]/mL (ref 0.450–4.500)

## 2023-11-13 ENCOUNTER — Encounter: Payer: Self-pay | Admitting: Family Medicine

## 2023-11-25 ENCOUNTER — Other Ambulatory Visit: Payer: Self-pay | Admitting: Family Medicine

## 2023-11-25 DIAGNOSIS — E039 Hypothyroidism, unspecified: Secondary | ICD-10-CM

## 2023-11-25 NOTE — Telephone Encounter (Signed)
Requested Prescriptions  Pending Prescriptions Disp Refills   levothyroxine (SYNTHROID) 125 MCG tablet [Pharmacy Med Name: LEVOTHYROXINE 0.125MG  ( ) TAB] 90 tablet 3    Sig: TAKE 1 TABLET(125 MCG) BY MOUTH DAILY BEFORE BREAKFAST     Endocrinology:  Hypothyroid Agents Passed - 11/25/2023  4:20 PM      Passed - TSH in normal range and within 360 days    TSH  Date Value Ref Range Status  11/11/2023 2.370 0.450 - 4.500 uIU/mL Final         Passed - Valid encounter within last 12 months    Recent Outpatient Visits           2 weeks ago Hypothyroidism, unspecified type   [place] [facility] [place], Megan P, DO   7 months ago Routine general medical examination at a health care facility   [facility] [place], [place] P, DO   1 year ago Tachycardia   [place] [facility] [place], [place] P, DO   1 year ago Acute pain of left knee   [place] [facility] [place], Megan P, DO   1 year ago Type 2 diabetes mellitus with hyperglycemia, without long-term current use of insulin [facility])   [place] [facility] [place], Oralia Rud, DO       Future Appointments             In 4 months Laural Benes, Oralia Rud, DO [place] [facility], [facility]

## 2023-12-14 LAB — HM DIABETES EYE EXAM

## 2024-02-04 ENCOUNTER — Other Ambulatory Visit: Payer: Self-pay | Admitting: Family Medicine

## 2024-02-06 ENCOUNTER — Encounter: Payer: Self-pay | Admitting: Family Medicine

## 2024-02-06 NOTE — Telephone Encounter (Signed)
 Too soon for refill, last refill 11/11/23 for 90 and 1 refill.  Requested Prescriptions  Pending Prescriptions Disp Refills   ezetimibe  (ZETIA ) 10 MG tablet [Pharmacy Med Name: EZETIMIBE  10MG  TABLETS] 90 tablet 1    Sig: TAKE 1 TABLET(10 MG) BY MOUTH DAILY     Cardiovascular:  Antilipid - Sterol Transport Inhibitors Failed - 02/06/2024 12:55 PM      Failed - AST in normal range and within 360 days    AST  Date Value Ref Range Status  11/11/2023 42 (H) 0 - 40 IU/L Final   AST (SGOT) Piccolo, Waived  Date Value Ref Range Status  07/12/2018 40 (H) 11 - 38 U/L Final         Failed - Valid encounter within last 12 months    Recent Outpatient Visits   None     Future Appointments             In 2 months Johnson, Megan P, DO [facility], [facility]            Failed - Lipid Panel in normal range within the last 12 months    Cholesterol, Total  Date Value Ref Range Status  11/11/2023 240 (H) 100 - 199 mg/dL Final   Cholesterol Piccolo, Waived  Date Value Ref Range Status  07/12/2018 214 (H) <200 mg/dL Final    Comment:                            Desirable                <200                         Borderline High      200- 239                         High                     >239    LDL Chol Calc (NIH)  Date Value Ref Range Status  11/11/2023 141 (H) 0 - 99 mg/dL Final   HDL  Date Value Ref Range Status  11/11/2023 43 >39 mg/dL Final   Triglycerides  Date Value Ref Range Status  11/11/2023 310 (H) 0 - 149 mg/dL Final   Triglycerides Piccolo,Waived  Date Value Ref Range Status  07/12/2018 298 (H) <150 mg/dL Final    Comment:                            Normal                   <150                         Borderline High     150 - 199                         High                200 - 499                         Very High                >  499          Passed - ALT in normal range and within 360 days    ALT  Date Value Ref Range Status   11/11/2023 42 0 - 44 IU/L Final   ALT (SGPT) Piccolo, Waived  Date Value Ref Range Status  07/12/2018 61 (H) 10 - 47 U/L Final         Passed - Patient is not pregnant

## 2024-02-08 ENCOUNTER — Other Ambulatory Visit: Payer: Self-pay | Admitting: Family Medicine

## 2024-02-08 MED ORDER — EZETIMIBE 10 MG PO TABS: ORAL_TABLET | ORAL | 0 refills | Status: DC

## 2024-04-11 ENCOUNTER — Encounter: Payer: 59 | Admitting: Family Medicine

## 2024-06-05 ENCOUNTER — Other Ambulatory Visit: Payer: Self-pay | Admitting: Family Medicine

## 2024-06-06 NOTE — Telephone Encounter (Signed)
 Requested Prescriptions  Pending Prescriptions Disp Refills   ezetimibe  (ZETIA ) 10 MG tablet [Pharmacy Med Name: EZETIMIBE  10MG  TABLETS] 90 tablet 0    Sig: TAKE 1 TABLET(10 MG) BY MOUTH DAILY     Cardiovascular:  Antilipid - Sterol Transport Inhibitors Failed - 06/06/2024 10:20 AM      Failed - AST in normal range and within 360 days    AST  Date Value Ref Range Status  11/11/2023 42 (H) 0 - 40 IU/L Final   AST (SGOT) Piccolo, Waived  Date Value Ref Range Status  07/12/2018 40 (H) 11 - 38 U/L Final         Failed - Valid encounter within last 12 months    Recent Outpatient Visits   None     Future Appointments             In 1 month Johnson, Megan P, DO [place] [facility], [address]            Failed - Lipid Panel in normal range within the last 12 months    Cholesterol, Total  Date Value Ref Range Status  11/11/2023 240 (H) 100 - 199 mg/dL Final   Cholesterol Piccolo, Waived  Date Value Ref Range Status  07/12/2018 214 (H) <200 mg/dL Final    Comment:                            Desirable                <200                         Borderline High      200- 239                         High                     >239    LDL Chol Calc (NIH)  Date Value Ref Range Status  11/11/2023 141 (H) 0 - 99 mg/dL Final   HDL  Date Value Ref Range Status  11/11/2023 43 >39 mg/dL Final   Triglycerides  Date Value Ref Range Status  11/11/2023 310 (H) 0 - 149 mg/dL Final   Triglycerides Piccolo,Waived  Date Value Ref Range Status  07/12/2018 298 (H) <150 mg/dL Final    Comment:                            Normal                   <150                         Borderline High     150 - 199                         High                200 - 499                         Very High                >499  Passed - ALT in normal range and within 360 days    ALT  Date Value Ref Range Status  11/11/2023 42 0 - 44 IU/L Final   ALT (SGPT) Piccolo,  Waived  Date Value Ref Range Status  07/12/2018 61 (H) 10 - 47 U/L Final         Passed - Patient is not pregnant

## 2024-06-13 ENCOUNTER — Encounter: Payer: Self-pay | Admitting: Family Medicine

## 2024-07-04 ENCOUNTER — Encounter: Admitting: Family Medicine

## 2024-07-06 ENCOUNTER — Encounter: Admitting: Family Medicine
